# Patient Record
Sex: Male | Born: 1990 | Race: White | Hispanic: No | Smoking: Never smoker
Health system: Southern US, Community
[De-identification: ages and names within clinical notes are randomized; demographics above are authoritative.]

## PROBLEM LIST (undated history)

## (undated) DIAGNOSIS — I10 Essential (primary) hypertension: Secondary | ICD-10-CM

## (undated) DIAGNOSIS — T7840XA Allergy, unspecified, initial encounter: Secondary | ICD-10-CM

## (undated) HISTORY — DX: Essential (primary) hypertension: I10

## (undated) HISTORY — DX: Allergy, unspecified, initial encounter: T78.40XA

---
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
## 2013-02-28 ENCOUNTER — Ambulatory Visit: Payer: Self-pay | Admitting: Family Medicine

## 2013-03-02 LAB — BETA STREP CULTURE(ARMC)

## 2016-09-28 ENCOUNTER — Emergency Department: Payer: 59

## 2016-09-28 ENCOUNTER — Emergency Department
Admission: EM | Admit: 2016-09-28 | Discharge: 2016-09-28 | Disposition: A | Discharge status: Home / Self Care | Payer: 59 | Attending: Emergency Medicine | Admitting: Emergency Medicine

## 2016-09-28 ENCOUNTER — Encounter: Payer: Self-pay | Admitting: Emergency Medicine

## 2016-09-28 DIAGNOSIS — R1032 Left lower quadrant pain: Secondary | ICD-10-CM | POA: Diagnosis not present

## 2016-09-28 DIAGNOSIS — R112 Nausea with vomiting, unspecified: Secondary | ICD-10-CM | POA: Diagnosis not present

## 2016-09-28 DIAGNOSIS — R1013 Epigastric pain: Secondary | ICD-10-CM | POA: Diagnosis present

## 2016-09-28 DIAGNOSIS — K760 Fatty (change of) liver, not elsewhere classified: Secondary | ICD-10-CM | POA: Diagnosis not present

## 2016-09-28 LAB — COMPREHENSIVE METABOLIC PANEL
ALBUMIN: 4.9 g/dL (ref 3.5–5.0)
ALT: 51 U/L (ref 17–63)
ANION GAP: 8 (ref 5–15)
AST: 32 U/L (ref 15–41)
Alkaline Phosphatase: 73 U/L (ref 38–126)
BILIRUBIN TOTAL: 0.6 mg/dL (ref 0.3–1.2)
BUN: 17 mg/dL (ref 6–20)
CO2: 26 mmol/L (ref 22–32)
Calcium: 10 mg/dL (ref 8.9–10.3)
Chloride: 104 mmol/L (ref 101–111)
Creatinine, Ser: 0.98 mg/dL (ref 0.61–1.24)
GFR calc Af Amer: >60 mL/min (ref >60)
Glucose, Bld: 101 mg/dL — ABNORMAL HIGH (ref 65–99)
POTASSIUM: 4 mmol/L (ref 3.5–5.1)
Sodium: 138 mmol/L (ref 135–145)
TOTAL PROTEIN: 7.9 g/dL (ref 6.5–8.1)

## 2016-09-28 LAB — URINALYSIS COMPLETE WITH MICROSCOPIC (ARMC ONLY)
BACTERIA UA: NONE SEEN
Bilirubin Urine: NEGATIVE
GLUCOSE, UA: NEGATIVE mg/dL
HGB URINE DIPSTICK: NEGATIVE
Ketones, ur: NEGATIVE mg/dL
LEUKOCYTES UA: NEGATIVE
NITRITE: NEGATIVE
PROTEIN: NEGATIVE mg/dL
SPECIFIC GRAVITY, URINE: 1.023 (ref 1.005–1.030)
WBC UA: NONE SEEN WBC/hpf (ref 0–5)
pH: 6 (ref 5.0–8.0)

## 2016-09-28 LAB — CBC
HEMATOCRIT: 46.1 % (ref 40.0–52.0)
HEMOGLOBIN: 15.6 g/dL (ref 13.0–18.0)
MCH: 30.3 pg (ref 26.0–34.0)
MCHC: 33.9 g/dL (ref 32.0–36.0)
MCV: 89.3 fL (ref 80.0–100.0)
Platelets: 342 10*3/uL (ref 150–440)
RBC: 5.16 MIL/uL (ref 4.40–5.90)
RDW: 13.4 % (ref 11.5–14.5)
WBC: 11.6 10*3/uL — AB (ref 3.8–10.6)

## 2016-09-28 LAB — LIPASE, BLOOD: Lipase: 26 U/L (ref 11–51)

## 2016-09-28 MED ORDER — TRAMADOL HCL 50 MG PO TABS: 50.0000 mg | ORAL_TABLET | Freq: Four times a day (QID) | ORAL | 0 refills | Status: DC | PRN

## 2016-09-28 MED ORDER — ONDANSETRON 4 MG PO TBDP: 4.0000 mg | ORAL_TABLET | Freq: Three times a day (TID) | ORAL | 0 refills | Status: DC | PRN

## 2016-09-28 NOTE — ED Notes (Signed)
Pt c/o upper abdominal pain that began yesterday. Pt ambulatory to room. Pt alert and oriented X4, active, cooperative, pt in NAD. RR even and unlabored, color WNL.  Denies NVD.

## 2016-09-28 NOTE — ED Provider Notes (Signed)
[facility] Emergency Department Provider Note  Time seen: 4:47 PM  I have reviewed the triage vital signs and the nursing notes.   HISTORY  Chief Complaint Abdominal Pain    HPI Devon Roach is a 25 y.o. male with no past medical history who presents the emergency department with epigastric and left-sided abdominal pain. According to the patient since this morning he has been experiencing epigastric and left-sided abdominal pain. He states it was severe earlier, mild currently. Says she was some nausea and one episode of vomiting. Denies any diarrhea. States he has been somewhat constipated for the past several days. Denies any black or bloody stool. Denies any dysuria or hematuria. Denies any fever. Describes the pain as cramping in nature  History reviewed. No pertinent past medical history.  There are no active problems to display for this patient.   History reviewed. No pertinent surgical history.  Prior to Admission medications   Not on File    Allergies no known allergies  No family history on file.  Social History Social History  Substance Use Topics  . Smoking status: Never Smoker  . Smokeless tobacco: Never Used  . Alcohol use Yes    Review of Systems Constitutional: Negative for fever. Cardiovascular: Negative for chest pain. Respiratory: Negative for shortness of breath. Gastrointestinal: Epigastric and left-sided abdominal pain. Genitourinary: Negative for dysuria. Negative for hematuria Musculoskeletal: Negative for back pain. Neurological: Negative for headache 10-point ROS otherwise negative.  ____________________________________________   PHYSICAL EXAM:  VITAL SIGNS: ED Triage Vitals  Enc Vitals Group     BP 09/28/16 1552 (!) 173/94     Pulse Rate 09/28/16 1552 90     Resp 09/28/16 1550 16     Temp 09/28/16 1550 98 F (36.7 C)     Temp Source 09/28/16 1550 Oral     SpO2 09/28/16 1552 99 %     Weight 09/28/16  1551 270 lb (122.5 kg)     Height 09/28/16 1551 5\' 10"  (1.778 m)     Head Circumference --      Peak Flow --      Pain Score 09/28/16 1552 8     Pain Loc --      Pain Edu? --      Excl. in GC? --     Constitutional: Alert and oriented. Well appearing and in no distress. Eyes: Normal exam ENT   Head: Normocephalic and atraumatic.   Mouth/Throat: Mucous membranes are moist. Cardiovascular: Normal rate, regular rhythm. No murmur Respiratory: Normal respiratory effort without tachypnea nor retractions. Breath sounds are clear Gastrointestinal: Soft, moderate left lower quadrant tenderness palpation. No rebound or guarding. No distention. Musculoskeletal: Nontender with normal range of motion in all extremities.  Neurologic:  Normal speech and language. No gross focal neurologic deficits Skin:  Skin is warm, dry and intact.  Psychiatric: Mood and affect are normal.   ____________________________________________   RADIOLOGY  CT is negative  ____________________________________________   INITIAL IMPRESSION / ASSESSMENT AND PLAN / ED COURSE  Pertinent labs & imaging results that were available during my care of the patient were reviewed by me and considered in my medical decision making (see chart for details).  Patient presents the emergency department epigastric and left-sided abdominal pain since this morning. He states it was severe this morning, mild currently. Denies any diarrhea, fever. States he has been nauseated all day with one episode of vomiting. No history of kidney stones. We'll proceed with a CT of  the abdomen to evaluate the left flank. Patient agreeable.  CT scan of the abdomen is negative. No acute abnormality noted. Patient states he is feeling better. Suspect possible enteritis as a cause of the patient's symptoms. We will discharge this short course of pain medication and PCP follow-up. I discussed strict return precautions for any worsening pain or fever  over the next several days.  ____________________________________________   FINAL CLINICAL IMPRESSION(S) / ED DIAGNOSES  Left flank pain    Minna AntisKevin Sotiria Keast, MD 09/28/16 [phone]

## 2016-09-28 NOTE — ED Notes (Signed)
Pt alert and oriented X4, active, cooperative, pt in NAD. RR even and unlabored, color WNL.  Pt informed to return if any life threatening symptoms occur.   

## 2016-09-28 NOTE — ED Triage Notes (Signed)
C/O abdominal pain.  Started yesterday morning.  C/O epigastric pain.  Denies nausea vomiting.

## 2016-09-29 ENCOUNTER — Observation Stay
Admission: EM | Admit: 2016-09-29 | Discharge: 2016-09-30 | Disposition: A | Discharge status: Home / Self Care | Payer: 59 | Attending: Internal Medicine | Admitting: Internal Medicine

## 2016-09-29 ENCOUNTER — Emergency Department: Payer: 59

## 2016-09-29 ENCOUNTER — Encounter: Payer: Self-pay | Admitting: Emergency Medicine

## 2016-09-29 DIAGNOSIS — R1013 Epigastric pain: Principal | ICD-10-CM | POA: Insufficient documentation

## 2016-09-29 DIAGNOSIS — Z6841 Body Mass Index (BMI) 40.0 and over, adult: Secondary | ICD-10-CM | POA: Insufficient documentation

## 2016-09-29 DIAGNOSIS — E669 Obesity, unspecified: Secondary | ICD-10-CM | POA: Insufficient documentation

## 2016-09-29 DIAGNOSIS — R1012 Left upper quadrant pain: Secondary | ICD-10-CM | POA: Insufficient documentation

## 2016-09-29 DIAGNOSIS — K760 Fatty (change of) liver, not elsewhere classified: Secondary | ICD-10-CM | POA: Insufficient documentation

## 2016-09-29 DIAGNOSIS — D72829 Elevated white blood cell count, unspecified: Secondary | ICD-10-CM | POA: Diagnosis not present

## 2016-09-29 DIAGNOSIS — K529 Noninfective gastroenteritis and colitis, unspecified: Secondary | ICD-10-CM | POA: Diagnosis not present

## 2016-09-29 DIAGNOSIS — F1010 Alcohol abuse, uncomplicated: Secondary | ICD-10-CM | POA: Diagnosis not present

## 2016-09-29 DIAGNOSIS — R109 Unspecified abdominal pain: Secondary | ICD-10-CM | POA: Diagnosis present

## 2016-09-29 DIAGNOSIS — R739 Hyperglycemia, unspecified: Secondary | ICD-10-CM | POA: Diagnosis not present

## 2016-09-29 DIAGNOSIS — R1032 Left lower quadrant pain: Secondary | ICD-10-CM | POA: Diagnosis not present

## 2016-09-29 DIAGNOSIS — A09 Infectious gastroenteritis and colitis, unspecified: Secondary | ICD-10-CM | POA: Diagnosis not present

## 2016-09-29 LAB — CBC
HEMATOCRIT: 43.9 % (ref 40.0–52.0)
Hemoglobin: 14.8 g/dL (ref 13.0–18.0)
MCH: 30.2 pg (ref 26.0–34.0)
MCHC: 33.6 g/dL (ref 32.0–36.0)
MCV: 89.7 fL (ref 80.0–100.0)
Platelets: 358 10*3/uL (ref 150–440)
RBC: 4.9 MIL/uL (ref 4.40–5.90)
RDW: 13.2 % (ref 11.5–14.5)
WBC: 17.2 10*3/uL — ABNORMAL HIGH (ref 3.8–10.6)

## 2016-09-29 LAB — COMPREHENSIVE METABOLIC PANEL
ALBUMIN: 4.9 g/dL (ref 3.5–5.0)
ALT: 54 U/L (ref 17–63)
AST: 32 U/L (ref 15–41)
Alkaline Phosphatase: 68 U/L (ref 38–126)
Anion gap: 9 (ref 5–15)
BILIRUBIN TOTAL: 0.6 mg/dL (ref 0.3–1.2)
BUN: 14 mg/dL (ref 6–20)
CO2: 24 mmol/L (ref 22–32)
CREATININE: 0.97 mg/dL (ref 0.61–1.24)
Calcium: 9.4 mg/dL (ref 8.9–10.3)
Chloride: 103 mmol/L (ref 101–111)
GFR calc Af Amer: >60 mL/min (ref >60)
GLUCOSE: 151 mg/dL — AB (ref 65–99)
Potassium: 3.7 mmol/L (ref 3.5–5.1)
Sodium: 136 mmol/L (ref 135–145)
TOTAL PROTEIN: 7.9 g/dL (ref 6.5–8.1)

## 2016-09-29 LAB — URINALYSIS COMPLETE WITH MICROSCOPIC (ARMC ONLY)
BACTERIA UA: NONE SEEN
BILIRUBIN URINE: NEGATIVE
Glucose, UA: NEGATIVE mg/dL
HGB URINE DIPSTICK: NEGATIVE
Ketones, ur: NEGATIVE mg/dL
LEUKOCYTES UA: NEGATIVE
NITRITE: NEGATIVE
PH: 7 (ref 5.0–8.0)
Protein, ur: NEGATIVE mg/dL
RBC / HPF: NONE SEEN RBC/hpf (ref 0–5)
Specific Gravity, Urine: 1.009 (ref 1.005–1.030)

## 2016-09-29 LAB — TROPONIN I: Troponin I: <0.03 ng/mL (ref <0.03)

## 2016-09-29 LAB — LACTIC ACID, PLASMA: Lactic Acid, Venous: 1.2 mmol/L (ref 0.5–1.9)

## 2016-09-29 LAB — TSH: TSH: 1.306 u[IU]/mL (ref 0.350–4.500)

## 2016-09-29 LAB — LIPASE, BLOOD: LIPASE: 21 U/L (ref 11–51)

## 2016-09-29 MED ORDER — CIPROFLOXACIN IN D5W 400 MG/200ML IV SOLN
INTRAVENOUS | Status: AC
  Administered 2016-09-29: 400 mg via INTRAVENOUS
  Filled 2016-09-29: qty 200

## 2016-09-29 MED ORDER — MORPHINE SULFATE (PF) 4 MG/ML IV SOLN
4.0000 mg | Freq: Once | INTRAVENOUS | Status: AC
  Administered 2016-09-29: 4 mg via INTRAVENOUS
  Filled 2016-09-29: qty 1

## 2016-09-29 MED ORDER — SODIUM CHLORIDE 0.9 % IV BOLUS (SEPSIS)
1000.0000 mL | Freq: Once | INTRAVENOUS | Status: AC
  Administered 2016-09-29: 1000 mL via INTRAVENOUS

## 2016-09-29 MED ORDER — CIPROFLOXACIN IN D5W 400 MG/200ML IV SOLN
INTRAVENOUS | Status: AC
  Filled 2016-09-29: qty 200

## 2016-09-29 MED ORDER — ONDANSETRON HCL 4 MG/2ML IJ SOLN
4.0000 mg | Freq: Four times a day (QID) | INTRAMUSCULAR | Status: DC | PRN
  Administered 2016-09-29 (×2): 4 mg via INTRAVENOUS
  Filled 2016-09-29 (×2): qty 2

## 2016-09-29 MED ORDER — IOPAMIDOL (ISOVUE-300) INJECTION 61%
100.0000 mL | Freq: Once | INTRAVENOUS | Status: AC | PRN
  Administered 2016-09-29: 100 mL via INTRAVENOUS

## 2016-09-29 MED ORDER — PANTOPRAZOLE SODIUM 40 MG PO TBEC
40.0000 mg | DELAYED_RELEASE_TABLET | Freq: Two times a day (BID) | ORAL | Status: DC
  Administered 2016-09-29 – 2016-09-30 (×3): 40 mg via ORAL
  Filled 2016-09-29 (×2): qty 1

## 2016-09-29 MED ORDER — MORPHINE SULFATE (PF) 2 MG/ML IV SOLN
2.0000 mg | INTRAVENOUS | Status: DC | PRN
  Administered 2016-09-29 (×2): 2 mg via INTRAVENOUS
  Filled 2016-09-29 (×2): qty 1

## 2016-09-29 MED ORDER — IOPAMIDOL (ISOVUE-300) INJECTION 61%
30.0000 mL | Freq: Once | INTRAVENOUS | Status: AC | PRN
  Administered 2016-09-29: 30 mL via ORAL

## 2016-09-29 MED ORDER — CIPROFLOXACIN IN D5W 400 MG/200ML IV SOLN
400.0000 mg | Freq: Two times a day (BID) | INTRAVENOUS | Status: DC
  Administered 2016-09-29 – 2016-09-30 (×3): 400 mg via INTRAVENOUS
  Filled 2016-09-29 (×4): qty 200

## 2016-09-29 MED ORDER — PANTOPRAZOLE SODIUM 40 MG PO TBEC
DELAYED_RELEASE_TABLET | ORAL | Status: AC
  Administered 2016-09-29: 40 mg via ORAL
  Filled 2016-09-29: qty 1

## 2016-09-29 MED ORDER — ONDANSETRON HCL 4 MG/2ML IJ SOLN
4.0000 mg | Freq: Once | INTRAMUSCULAR | Status: AC
  Administered 2016-09-29: 4 mg via INTRAVENOUS
  Filled 2016-09-29: qty 2

## 2016-09-29 MED ORDER — ENOXAPARIN SODIUM 40 MG/0.4ML ~~LOC~~ SOLN
40.0000 mg | SUBCUTANEOUS | Status: DC
  Filled 2016-09-29: qty 0.4

## 2016-09-29 MED ORDER — ONDANSETRON HCL 4 MG PO TABS
4.0000 mg | ORAL_TABLET | Freq: Four times a day (QID) | ORAL | Status: DC | PRN
Start: 2016-09-29 — End: 2016-09-30

## 2016-09-29 MED ORDER — METRONIDAZOLE IN NACL 5-0.79 MG/ML-% IV SOLN
INTRAVENOUS | Status: AC
  Administered 2016-09-30: 15:00:00
  Filled 2016-09-29: qty 100

## 2016-09-29 MED ORDER — METRONIDAZOLE IN NACL 5-0.79 MG/ML-% IV SOLN
500.0000 mg | Freq: Three times a day (TID) | INTRAVENOUS | Status: DC
  Administered 2016-09-29 – 2016-09-30 (×4): 500 mg via INTRAVENOUS
  Filled 2016-09-29 (×6): qty 100

## 2016-09-29 NOTE — ED Notes (Signed)
MD at bedside. 

## 2016-09-29 NOTE — ED Provider Notes (Signed)
Patient signed out to me by Dr. Zenda AlpersWebster, she asked me to follow-up on the CT scan and discuss appropriate. Patient continues to have pain, CT scan is unremarkable although we did discuss the fatty liver. I will admit to the hospitalist service for further evaluation   Devon Everyobert Stellar Gensel, MD 09/29/16 [phone]

## 2016-09-29 NOTE — ED Notes (Signed)
Unsuccessful IV start x2 (RAC, LAC) asking another RN to try.

## 2016-09-29 NOTE — ED Triage Notes (Addendum)
Pt to triage via w/c with no distress noted; st seen here earlier and dx with small intestine infection; c/o persistent mid upper abd pain accomp by nausea/vomiting; pt refuses protocols stating "I just want something to help me go to sleep and take the pain away"; instructed on importance & purpose of performing protocols but pt cont to refuses stating "I already gave 4 vials, I ain't giving no more"

## 2016-09-29 NOTE — ED Provider Notes (Signed)
[facility] Emergency Department Provider Note   ____________________________________________   First MD Initiated Contact with Patient 09/29/16 [phone]     (approximate)  I have reviewed the triage vital signs and the nursing notes.   HISTORY  Chief Complaint Abdominal Pain    HPI Devon Roach is a 25 y.o. male who comes into the hospital today with epigastric pain and vomiting. The patient was seen here earlier in the day and was let go. He did have a scan that was negative. He reports that he's never had any pain like this before. This pain started yesterday. He reports that his emesis looks like what he ate. His pain is a 10 out of 10 in intensity and has been getting worse. He reports that he's had no fevers. He's had some shortness of breath. He thinks it's because of the pain. He has had some diarrhea earlier when he arrived to the emergency department. When the patient was seen in the emergency department earlier he was told to come back to the hospital if his pain is getting worse. The patient is unable to tolerate the pain at this time. He has no chest pain or ache and denies any blurred vision.   History reviewed. No pertinent past medical history.  There are no active problems to display for this patient.   History reviewed. No pertinent surgical history.  Prior to Admission medications   Medication Sig Start Date End Date Taking? Authorizing Provider  ondansetron (ZOFRAN ODT) 4 MG disintegrating tablet Take 1 tablet (4 mg total) by mouth every 8 (eight) hours as needed for nausea or vomiting. 09/28/16  Yes Minna Antis, MD  traMADol (ULTRAM) 50 MG tablet Take 1 tablet (50 mg total) by mouth every 6 (six) hours as needed. 09/28/16 09/28/17 Yes Minna Antis, MD    Allergies Review of patient's allergies indicates no known allergies.  No family history on file.  Social History Social History  Substance Use Topics  . Smoking status:  Never Smoker  . Smokeless tobacco: Never Used  . Alcohol use Yes    Review of Systems Constitutional: No fever/chills Eyes: No visual changes. ENT: No sore throat. Cardiovascular: Denies chest pain. Respiratory: Denies shortness of breath. Gastrointestinal:  abdominal pain.  nausea, vomiting.  No diarrhea.  No constipation. Genitourinary: Negative for dysuria. Musculoskeletal: Negative for back pain. Skin: Negative for rash. Neurological: Negative for headaches, focal weakness or numbness.  10-point ROS otherwise negative.  ____________________________________________   PHYSICAL EXAM:  VITAL SIGNS: ED Triage Vitals  Enc Vitals Group     BP 09/29/16 0232 (!) 187/110     Pulse Rate 09/29/16 0232 (!) 51     Resp 09/29/16 0232 20     Temp 09/29/16 0232 97.7 F (36.5 C)     Temp Source 09/29/16 0232 Oral     SpO2 09/29/16 0232 100 %     Weight 09/29/16 0233 270 lb (122.5 kg)     Height 09/29/16 0233 5\' 10"  (1.778 m)     Head Circumference --      Peak Flow --      Pain Score 09/29/16 0234 10     Pain Loc --      Pain Edu? --      Excl. in GC? --     Constitutional: Alert and oriented. Well appearing and in Moderate distress. Eyes: Conjunctivae are normal. PERRL. EOMI. Head: Atraumatic. Nose: No congestion/rhinnorhea. Mouth/Throat: Mucous membranes are moist.  Oropharynx non-erythematous.  Cardiovascular: Normal rate, regular rhythm. Grossly normal heart sounds.  Good peripheral circulation. Respiratory: Normal respiratory effort.  No retractions. Lungs CTAB. Gastrointestinal: Soft and nontender. No distention. Positive bowel sounds Musculoskeletal: No lower extremity tenderness nor edema.   Neurologic:  Normal speech and language.  Skin:  Skin is warm, dry and intact. Periorbital petechiae noted Psychiatric: Mood and affect are normal.   ____________________________________________   LABS (all labs ordered are listed, but only abnormal results are  displayed)  Labs Reviewed  CBC - Abnormal; Notable for the following:       Result Value   WBC 17.2 (*)    All other components within normal limits  COMPREHENSIVE METABOLIC PANEL - Abnormal; Notable for the following:    Glucose, Bld 151 (*)    All other components within normal limits  LIPASE, BLOOD   ____________________________________________  EKG  None ____________________________________________  RADIOLOGY  Ultrasound abdomen CT abdomen and pelvis ____________________________________________   PROCEDURES  Procedure(s) performed: None  Procedures  Critical Care performed: No  ____________________________________________   INITIAL IMPRESSION / ASSESSMENT AND PLAN / ED COURSE  Pertinent labs & imaging results that were available during my care of the patient were reviewed by me and considered in my medical decision making (see chart for details).  This is a 25 year old male who comes into the hospital today with abdominal pain. The patient had this pain and was seen yesterday. He reports that his epigastric pain. He is vomiting as well. The patient does have some petechiae around his eyes from his forceful vomiting. The patient reports that he could not tolerate the pain home to return to the emergency department. We did repeat the patient's blood work and his white count is now 17.2 up from 11.4. I did send the patient for an ultrasound to evaluate his pain.  Clinical Course  Value Comment By Time  US Abdomen Limited RUQ No acute RIGHT upper quadrant process.  Hepatic steatosis.   Rebecka ApleyAllison P Webster, MD 10/04 [phone]   After reviewing the results of the ultrasound I talked to the patient. At this time the only choice is to do a repeat CT with contrast. The patient reports he is still hurting after 2 doses of morphine. We'll see if we can determine the cause of his pain.  The patient's care will be signed out to Dr. Cyril LoosenKinner who will follow-up the results of the  CT scan and disposition the patient. ____________________________________________   FINAL CLINICAL IMPRESSION(S) / ED DIAGNOSES  Final diagnoses:  Abdominal pain      NEW MEDICATIONS STARTED DURING THIS VISIT:  New Prescriptions   No medications on file     Note:  This document was prepared using Dragon voice recognition software and may include unintentional dictation errors.    Rebecka ApleyAllison P Webster, MD 09/29/16 [phone]

## 2016-09-29 NOTE — H&P (Signed)
[facility] - [place] at [facility]   PATIENT NAME: Devon Roach    MR#:  [id number]  DATE OF BIRTH:  06/03/1991  DATE OF ADMISSION:  09/29/2016  PRIMARY CARE PHYSICIAN: No PCP Per Patient   REQUESTING/REFERRING PHYSICIAN:   CHIEF COMPLAINT:   Chief Complaint  Patient presents with  . Abdominal Pain    HISTORY OF PRESENT ILLNESS: Devon Roach  is a 25 y.o. male with not significant past medical history who presents to the hospital with complaints of abdominal pain. When the patient, pain started about 2 nights ago, pain is described as sharp, cramping pain all over the abdomen, yesterday it was in left lower quadrant today in right upper quadrant, epigastric area, right upper quadrant. It is associated with nausea and vomiting, but no hematochezia or hematemesis. He also had few episodes of loose yellow liquidy stool. Because of abdominal pain. He presented to emergency room yesterday, was given medications, had CT scan of abdomen without contrast, which was unremarkable, and he was discharged. However, continued to have severe pains in upper abdomen, pain was described as sharp, intermittent, however, now it became constant, worsening with turning to the left side, but no significant change after eating, vomiting. The patient also noted that he is more presyncopal, having hot flashes and feeling cold intermittently and the feeling that his  urinary output has decreased. He also cannot empty his bladder. He denies any tobacco abuse, however, admits of alcohol use, couple of beers a week. In the past, however cutting down on alcohol abuse. He presented back to emergency room today, had CT scan of abdomen and pelvis with contrast, which still was unremarkable, but fatty liver, he also underwent a right upper quadrant ultrasound, normal, but fatty liver was noted. Hospitalist services were contacted for admission as patient pain remains severe. No drug abuse history  PAST  MEDICAL HISTORY:  History reviewed. No pertinent past medical history.  PAST SURGICAL HISTORY: History reviewed. No pertinent surgical history.  SOCIAL HISTORY:  Social History  Substance Use Topics  . Smoking status: Never Smoker  . Smokeless tobacco: Never Used  . Alcohol use Yes    FAMILY HISTORY: No early coronary artery disease  DRUG ALLERGIES: No Known Allergies  Review of Systems  Constitutional: Positive for chills, fever and malaise/fatigue. Negative for weight loss.  HENT: Negative for congestion.   Eyes: Negative for blurred vision and double vision.  Respiratory: Negative for cough, sputum production, shortness of breath and wheezing.   Cardiovascular: Negative for chest pain, palpitations, orthopnea, leg swelling and PND.  Gastrointestinal: Positive for abdominal pain, diarrhea, nausea and vomiting. Negative for blood in stool and constipation.  Genitourinary: Negative for dysuria, frequency, hematuria and urgency.  Musculoskeletal: Negative for falls.  Neurological: Negative for dizziness, tremors, focal weakness and headaches.  Endo/Heme/Allergies: Does not bruise/bleed easily.  Psychiatric/Behavioral: Negative for depression. The patient does not have insomnia.     MEDICATIONS AT HOME:  Prior to Admission medications   Medication Sig Start Date End Date Taking? Authorizing Provider  ondansetron (ZOFRAN ODT) 4 MG disintegrating tablet Take 1 tablet (4 mg total) by mouth every 8 (eight) hours as needed for nausea or vomiting. 09/28/16  Yes Minna Antis, MD  traMADol (ULTRAM) 50 MG tablet Take 1 tablet (50 mg total) by mouth every 6 (six) hours as needed. 09/28/16 09/28/17 Yes Minna Antis, MD      PHYSICAL EXAMINATION:   VITAL SIGNS: Blood pressure (!) 162/109, pulse 61, temperature 97.7  F (36.5 C), temperature source Oral, resp. rate (!) 26, height 5\' 10"  (1.778 m), weight 122.5 kg (270 lb), SpO2 98 %.  GENERAL:  25 y.o.-year-old young, obese patient  lying in the bed In mild to moderate distress due to abdominal pain, diaphoretic, flushed and face.  EYES: Pupils equal, round, reactive to light and accommodation. No scleral icterus. Extraocular muscles intact.  HEENT: Head atraumatic, normocephalic. Oropharynx and nasopharynx clear.  NECK:  Supple, no jugular venous distention. No thyroid enlargement, no tenderness.  LUNGS: Normal breath sounds bilaterally, no wheezing, rales,rhonchi or crepitation. No use of accessory muscles of respiration.  CARDIOVASCULAR: S1, S2 normal. No murmurs, rubs, or gallops.  ABDOMEN: Soft, tender in upper abdomen, mostly epigastric area, but no rebound or guarding was noted, nondistended. Bowel sounds present. No organomegaly or mass.  EXTREMITIES: No pedal edema, cyanosis, or clubbing.  NEUROLOGIC: Cranial nerves II through XII are intact. Muscle strength 5/5 in all extremities. Sensation intact. Gait not checked.  PSYCHIATRIC: The patient is alert and oriented x 3.  SKIN: No obvious rash, lesion, or ulcer.   LABORATORY PANEL:   CBC  Recent Labs Lab 09/28/16 1555 09/29/16 0527  WBC 11.6* 17.2*  HGB 15.6 14.8  HCT 46.1 43.9  PLT 342 358  MCV 89.3 89.7  MCH 30.3 30.2  MCHC 33.9 33.6  RDW 13.4 13.2   ------------------------------------------------------------------------------------------------------------------  Chemistries   Recent Labs Lab 09/29/16 0527  NA 136  K 3.7  CL 103  CO2 24  GLUCOSE 151*  BUN 14  CREATININE 0.97  CALCIUM 9.4  AST 32  ALT 54  ALKPHOS 68  BILITOT 0.6   ------------------------------------------------------------------------------------------------------------------  Cardiac Enzymes  Recent Labs Lab 09/29/16 0527  TROPONINI <0.03   ------------------------------------------------------------------------------------------------------------------  RADIOLOGY: Ct Abdomen Pelvis W Contrast  Result Date: 09/29/2016 CLINICAL DATA:  Acute epigastric  abdominal pain. EXAM: CT ABDOMEN AND PELVIS WITH CONTRAST TECHNIQUE: Multidetector CT imaging of the abdomen and pelvis was performed using the standard protocol following bolus administration of intravenous contrast. CONTRAST:  100mL ISOVUE-300 IOPAMIDOL (ISOVUE-300) INJECTION 61% COMPARISON:  CT scan of September 28, 2016. FINDINGS: Lower chest: Visualized lung bases are unremarkable. Hepatobiliary: No gallstones are noted. Fatty infiltration of the liver is noted. Pancreas: Normal. Spleen: Normal. Adrenals/Urinary Tract: Adrenal glands and kidneys appear normal. No hydronephrosis or renal obstruction is noted. No renal or ureteral calculi are noted. Urinary bladder appears normal. Stomach/Bowel: The appendix appears normal. There is no evidence of bowel obstruction. Vascular/Lymphatic: No significant adenopathy is noted. Reproductive: Normal prostate gland. Other: No abnormal fluid collection is noted. Musculoskeletal: No significant osseous abnormality is noted. IMPRESSION: Fatty infiltration of the liver. No other abnormality seen in the abdomen or pelvis. Electronically Signed   By: Lupita RaiderJames  Green Jr, M.D.   On: 09/29/2016 09:00   Ct Renal Stone Study  Result Date: 09/28/2016 CLINICAL DATA:  Epigastric abdominal pain started yesterday. Left flank pain. EXAM: CT ABDOMEN AND PELVIS WITHOUT CONTRAST TECHNIQUE: Multidetector CT imaging of the abdomen and pelvis was performed following the standard protocol without IV contrast. COMPARISON:  None. FINDINGS: Lower chest: Lung bases are clear.  No pleural effusions. Hepatobiliary: Liver is diffusely low density and suggestive for hepatic steatosis. There is focal fat sparing adjacent to the gallbladder. Normal appearance of the gallbladder. No significant biliary dilatation. Pancreas: Normal appearance of the pancreas without inflammation or duct dilatation. Spleen: Normal appearance of spleen without enlargement. Adrenals/Urinary Tract: Normal adrenal glands. Normal  appearance of both kidneys without stones or hydronephrosis.  Urinary bladder is unremarkable. Stomach/Bowel: Stomach is within normal limits. Appendix appears normal. No evidence of bowel wall thickening, distention, or inflammatory changes. Vascular/Lymphatic: No significant vascular findings. Slightly prominent inguinal lymph nodes are nonspecific. Otherwise, no significant lymph node enlargement in the abdomen or pelvis. Reproductive: No gross abnormality to the prostate or seminal vesicles. Other: No free fluid.  No free air. Musculoskeletal: No acute or significant osseous findings. IMPRESSION: No acute abnormality in the abdomen or pelvis. Hepatic steatosis. Electronically Signed   By: Richarda Overlie M.D.   On: 09/28/2016 18:07   US Abdomen Limited Ruq  Result Date: 09/29/2016 CLINICAL DATA:  Abdominal pain for 2 days. EXAM: US ABDOMEN LIMITED - RIGHT UPPER QUADRANT COMPARISON:  CT abdomen and pelvis September 28, 2016 FINDINGS: Gallbladder: No gallstones or wall thickening visualized. No sonographic Murphy sign noted by sonographer. Common bile duct: Diameter: 3 mm Liver: No focal lesion identified. Diffusely increased echogenicity. No intrahepatic biliary dilatation. IMPRESSION: No acute RIGHT upper quadrant process. Hepatic steatosis. Electronically Signed   By: Awilda Metro M.D.   On: 09/29/2016 06:28    EKG: No orders found for this or any previous visit.  IMPRESSION AND PLAN:  Active Problems:   Right sided abdominal pain #1. Acute gastroenteritis, concerning for infectious, get stool cultures, including C. difficile, initiate patient on Cipro and Flagyl, follow clinically, get lactic acid level #2. Leukocytosis, follow with antibiotic therapy #3. Hyperglycemia in obese patient, get hemoglobin A1c to rule out diabetes #4. Fatty liver disease, getting hemoglobin A1c, TSH, lipid panel, patient was counseled about alcohol abuse and risk of developing liver cirrhosis, he voiced understanding,  all questions were answered  All the records are reviewed and case discussed with ED provider. Management plans discussed with the patient, family and they are in agreement.  CODE STATUS: Code Status History    This patient does not have a recorded code status. Please follow your organizational policy for patients in this situation.       TOTAL TIME TAKING CARE OF THIS PATIENT: 60 minutes.   Discussed with the patient and his family Jamarri Vuncannon M.D on 09/29/2016 at 10:35 AM  Between 7am to 6pm - Pager - [phone] After 6pm go to [email or web address] - password [email or web address] [facility]  [place] [facility]  Office  [phone]  CC: Primary care physician; No PCP Per Patient

## 2016-09-30 DIAGNOSIS — R1032 Left lower quadrant pain: Secondary | ICD-10-CM | POA: Diagnosis not present

## 2016-09-30 DIAGNOSIS — R109 Unspecified abdominal pain: Secondary | ICD-10-CM | POA: Diagnosis not present

## 2016-09-30 DIAGNOSIS — R739 Hyperglycemia, unspecified: Secondary | ICD-10-CM | POA: Diagnosis not present

## 2016-09-30 DIAGNOSIS — R1013 Epigastric pain: Secondary | ICD-10-CM | POA: Diagnosis not present

## 2016-09-30 DIAGNOSIS — R030 Elevated blood-pressure reading, without diagnosis of hypertension: Secondary | ICD-10-CM | POA: Diagnosis not present

## 2016-09-30 DIAGNOSIS — Z6841 Body Mass Index (BMI) 40.0 and over, adult: Secondary | ICD-10-CM | POA: Diagnosis not present

## 2016-09-30 DIAGNOSIS — D72829 Elevated white blood cell count, unspecified: Secondary | ICD-10-CM | POA: Diagnosis not present

## 2016-09-30 DIAGNOSIS — R1012 Left upper quadrant pain: Secondary | ICD-10-CM | POA: Diagnosis not present

## 2016-09-30 DIAGNOSIS — E669 Obesity, unspecified: Secondary | ICD-10-CM | POA: Diagnosis not present

## 2016-09-30 DIAGNOSIS — K529 Noninfective gastroenteritis and colitis, unspecified: Secondary | ICD-10-CM | POA: Diagnosis not present

## 2016-09-30 DIAGNOSIS — K760 Fatty (change of) liver, not elsewhere classified: Secondary | ICD-10-CM | POA: Diagnosis not present

## 2016-09-30 LAB — COMPREHENSIVE METABOLIC PANEL
ALT: 47 U/L (ref 17–63)
ANION GAP: 8 (ref 5–15)
AST: 21 U/L (ref 15–41)
Albumin: 4.5 g/dL (ref 3.5–5.0)
Alkaline Phosphatase: 71 U/L (ref 38–126)
BUN: 8 mg/dL (ref 6–20)
CHLORIDE: 104 mmol/L (ref 101–111)
CO2: 27 mmol/L (ref 22–32)
Calcium: 9.4 mg/dL (ref 8.9–10.3)
Creatinine, Ser: 1.02 mg/dL (ref 0.61–1.24)
GFR calc non Af Amer: >60 mL/min (ref >60)
Glucose, Bld: 116 mg/dL — ABNORMAL HIGH (ref 65–99)
POTASSIUM: 3.6 mmol/L (ref 3.5–5.1)
SODIUM: 139 mmol/L (ref 135–145)
Total Bilirubin: 1 mg/dL (ref 0.3–1.2)
Total Protein: 7.6 g/dL (ref 6.5–8.1)

## 2016-09-30 LAB — GLUCOSE, CAPILLARY: GLUCOSE-CAPILLARY: 112 mg/dL — AB (ref 65–99)

## 2016-09-30 LAB — LIPID PANEL
CHOL/HDL RATIO: 5.5 ratio
CHOLESTEROL: 170 mg/dL (ref 0–200)
HDL: 31 mg/dL — ABNORMAL LOW (ref >40)
LDL Cholesterol: 114 mg/dL — ABNORMAL HIGH (ref 0–99)
Triglycerides: 125 mg/dL (ref <150)
VLDL: 25 mg/dL (ref 0–40)

## 2016-09-30 LAB — CBC
HCT: 45.6 % (ref 40.0–52.0)
HEMOGLOBIN: 16 g/dL (ref 13.0–18.0)
MCH: 31.2 pg (ref 26.0–34.0)
MCHC: 35.1 g/dL (ref 32.0–36.0)
MCV: 88.8 fL (ref 80.0–100.0)
Platelets: 360 10*3/uL (ref 150–440)
RBC: 5.13 MIL/uL (ref 4.40–5.90)
RDW: 13.7 % (ref 11.5–14.5)
WBC: 10 10*3/uL (ref 3.8–10.6)

## 2016-09-30 LAB — HEMOGLOBIN A1C
HEMOGLOBIN A1C: 5.4 % (ref 4.8–5.6)
MEAN PLASMA GLUCOSE: 108 mg/dL

## 2016-09-30 MED ORDER — PANTOPRAZOLE SODIUM 40 MG PO TBEC: 40.0000 mg | DELAYED_RELEASE_TABLET | Freq: Every day | ORAL | 0 refills | Status: DC

## 2016-09-30 MED ORDER — LISINOPRIL 20 MG PO TABS: 20.0000 mg | ORAL_TABLET | Freq: Every day | ORAL | 0 refills | Status: DC

## 2016-09-30 MED ORDER — LISINOPRIL 20 MG PO TABS
20.0000 mg | ORAL_TABLET | Freq: Every day | ORAL | Status: DC
  Administered 2016-09-30: 20 mg via ORAL
  Filled 2016-09-30: qty 1

## 2016-09-30 MED ORDER — DOCUSATE SODIUM 100 MG PO CAPS
100.0000 mg | ORAL_CAPSULE | Freq: Once | ORAL | Status: AC
  Administered 2016-09-30: 15:00:00 100 mg via ORAL
  Filled 2016-09-30: qty 1

## 2016-09-30 MED ORDER — TRAMADOL HCL 50 MG PO TABS
50.0000 mg | ORAL_TABLET | Freq: Four times a day (QID) | ORAL | Status: DC | PRN
  Administered 2016-09-30: 50 mg via ORAL
  Filled 2016-09-30: qty 1

## 2016-09-30 NOTE — Progress Notes (Signed)
Received Md order to discharge patient to home reviewed discharge instructions prescriptions and home meds with patient and mother both verbalized understanding

## 2016-09-30 NOTE — Progress Notes (Signed)
Nutrition Education Note  RD consulted for nutrition education regarding weight loss and low sodium diet   RD provided "Weight Loss Tips and Low Sodium Nutrition Therapy" handout from the Academy of Nutrition and Dietetics. Reviewed patient's dietary recall. Provided examples on ways to decrease sodium and fat intake in diet. Discouraged intake of processed foods and use of salt shaker. Encouraged fresh fruits and vegetables as well as whole grain sources of carbohydrates to maximize fiber intake. Discussed low calorie food options and importance of exercise to meet wt loss goals. Teach back method used.  Expect good compliance.  Body mass index is 39.95 kg/m. Pt meets criteria for obesity unspecified based on current BMI.  Current diet order is soft, patient is consuming approximately 100 % of meals at this time. Labs and medications reviewed. No further nutrition interventions warranted at this time. RD contact information provided. If additional nutrition issues arise, please re-consult RD.  Ailyn Gladd B. Freida BusmanAllen, RD, LDN [phone] (pager) Weekend/On-Call pager [phone])

## 2016-09-30 NOTE — Discharge Summary (Signed)
[facility] - [place] at [facility]   PATIENT NAME: Leevon Upperman    MR#:  [id number]  DATE OF BIRTH:  25-Feb-1991  DATE OF ADMISSION:  09/29/2016 ADMITTING PHYSICIAN: Katharina Caper, MD  DATE OF DISCHARGE: 09/30/2016  PRIMARY CARE PHYSICIAN: Dr Venora Maples    ADMISSION DIAGNOSIS:  Abdominal pain [R10.9]  DISCHARGE DIAGNOSIS:  Active Problems:   Right sided abdominal pain   SECONDARY DIAGNOSIS:  History reviewed. No pertinent past medical history.  HOSPITAL COURSE:   25 year old male with obesity who presents with abdominal pain. For further details please further H&P.  1. Abdominal pain: Patient is describing midepigastric abdominal pain with nausea. CT scan on admission showed fatty liver but no acute etiology. Right upper quadrant ultrasound also showed fatty liver but no gallstones. I am suspecting the patient must have gastritis even the location of pain. Patient's symptoms have improved with Protonix. He will continue this for 30 days. If pain persists then he will need GI evaluation.  2. Fatty liver: Patient is encouraged to exercise and lose weight. Dietitian consult was requested.  3. Elevated blood pressure no diagnosis of hypertension: Patient had persistently elevated blood pressures. Patient is started on lisinopril 20 mg daily. Patient will follow-up with primary care physician in one week for blood pressure as well as BMP. He was asked to monitor his blood pressure twice a day at the same time every day and log this for his primary care physician.  4. Obesity: Patient is encouraged to lose weight and exercise.  DISCHARGE CONDITIONS AND DIET:  Stable Soft diet  CONSULTS OBTAINED:    DRUG ALLERGIES:  No Known Allergies  DISCHARGE MEDICATIONS:   Current Discharge Medication List    START taking these medications   Details  lisinopril (PRINIVIL,ZESTRIL) 20 MG tablet Take 1 tablet (20 mg total) by mouth daily. Qty: 30 tablet, Refills: 0     pantoprazole (PROTONIX) 40 MG tablet Take 1 tablet (40 mg total) by mouth daily. Qty: 30 tablet, Refills: 0      CONTINUE these medications which have NOT CHANGED   Details  ondansetron (ZOFRAN ODT) 4 MG disintegrating tablet Take 1 tablet (4 mg total) by mouth every 8 (eight) hours as needed for nausea or vomiting. Qty: 20 tablet, Refills: 0      STOP taking these medications     traMADol (ULTRAM) 50 MG tablet               Today   CHIEF COMPLAINT:  Doing better this am tolerated diet this am  Abdominal pain improved   VITAL SIGNS:  Blood pressure (!) 164/102, pulse 83, temperature 98.8 F (37.1 C), resp. rate 18, height 5\' 10"  (1.778 m), weight 126.3 kg (278 lb 6.4 oz), SpO2 99 %.   REVIEW OF SYSTEMS:  Review of Systems  Constitutional: Negative.  Negative for chills, fever and malaise/fatigue.  HENT: Negative.  Negative for ear discharge, ear pain, hearing loss, nosebleeds and sore throat.   Eyes: Negative.  Negative for blurred vision and pain.  Respiratory: Negative.  Negative for cough, hemoptysis, shortness of breath and wheezing.   Cardiovascular: Negative.  Negative for chest pain, palpitations and leg swelling.  Gastrointestinal: Negative.  Negative for abdominal pain, blood in stool, diarrhea, nausea and vomiting.  Genitourinary: Negative.  Negative for dysuria.  Musculoskeletal: Negative.  Negative for back pain.  Skin: Negative.   Neurological: Negative for dizziness, tremors, speech change, focal weakness, seizures and headaches.  Endo/Heme/Allergies: Negative.  Does  not bruise/bleed easily.  Psychiatric/Behavioral: Negative.  Negative for depression, hallucinations and suicidal ideas.     PHYSICAL EXAMINATION:  GENERAL:  25 y.o.-year-old patient lying in the bed with no acute distress.  NECK:  Supple, no jugular venous distention. No thyroid enlargement, no tenderness.  LUNGS: Normal breath sounds bilaterally, no wheezing, rales,rhonchi  No use  of accessory muscles of respiration.  CARDIOVASCULAR: S1, S2 normal. No murmurs, rubs, or gallops.  ABDOMEN: Soft, non-tender, non-distended. Bowel sounds present. No organomegaly or mass.  EXTREMITIES: No pedal edema, cyanosis, or clubbing.  PSYCHIATRIC: The patient is alert and oriented x 3.  SKIN: No obvious rash, lesion, or ulcer.   DATA REVIEW:   CBC  Recent Labs Lab 09/30/16 0402  WBC 10.0  HGB 16.0  HCT 45.6  PLT 360    Chemistries   Recent Labs Lab 09/30/16 0402  NA 139  K 3.6  CL 104  CO2 27  GLUCOSE 116*  BUN 8  CREATININE 1.02  CALCIUM 9.4  AST 21  ALT 47  ALKPHOS 71  BILITOT 1.0    Cardiac Enzymes  Recent Labs Lab 09/29/16 0527  TROPONINI <0.03    Microbiology Results  @[email or web address] @  RADIOLOGY:  Ct Abdomen Pelvis W Contrast  Result Date: 09/29/2016 CLINICAL DATA:  Acute epigastric abdominal pain. EXAM: CT ABDOMEN AND PELVIS WITH CONTRAST TECHNIQUE: Multidetector CT imaging of the abdomen and pelvis was performed using the standard protocol following bolus administration of intravenous contrast. CONTRAST:  ISOVUE-300 IOPAMIDOL (ISOVUE-300) INJECTION 61% COMPARISON:  CT scan of September 28, 2016. FINDINGS: Lower chest: Visualized lung bases are unremarkable. Hepatobiliary: No gallstones are noted. Fatty infiltration of the liver is noted. Pancreas: Normal. Spleen: Normal. Adrenals/Urinary Tract: Adrenal glands and kidneys appear normal. No hydronephrosis or renal obstruction is noted. No renal or ureteral calculi are noted. Urinary bladder appears normal. Stomach/Bowel: The appendix appears normal. There is no evidence of bowel obstruction. Vascular/Lymphatic: No significant adenopathy is noted. Reproductive: Normal prostate gland. Other: No abnormal fluid collection is noted. Musculoskeletal: No significant osseous abnormality is noted. IMPRESSION: Fatty infiltration of the liver. No other abnormality seen in the abdomen or pelvis. Electronically  Signed   By: Lupita Raider, M.D.   On: 09/29/2016 09:00   Ct Renal Stone Study  Result Date: 09/28/2016 CLINICAL DATA:  Epigastric abdominal pain started yesterday. Left flank pain. EXAM: CT ABDOMEN AND PELVIS WITHOUT CONTRAST TECHNIQUE: Multidetector CT imaging of the abdomen and pelvis was performed following the standard protocol without IV contrast. COMPARISON:  None. FINDINGS: Lower chest: Lung bases are clear.  No pleural effusions. Hepatobiliary: Liver is diffusely low density and suggestive for hepatic steatosis. There is focal fat sparing adjacent to the gallbladder. Normal appearance of the gallbladder. No significant biliary dilatation. Pancreas: Normal appearance of the pancreas without inflammation or duct dilatation. Spleen: Normal appearance of spleen without enlargement. Adrenals/Urinary Tract: Normal adrenal glands. Normal appearance of both kidneys without stones or hydronephrosis. Urinary bladder is unremarkable. Stomach/Bowel: Stomach is within normal limits. Appendix appears normal. No evidence of bowel wall thickening, distention, or inflammatory changes. Vascular/Lymphatic: No significant vascular findings. Slightly prominent inguinal lymph nodes are nonspecific. Otherwise, no significant lymph node enlargement in the abdomen or pelvis. Reproductive: No gross abnormality to the prostate or seminal vesicles. Other: No free fluid.  No free air. Musculoskeletal: No acute or significant osseous findings. IMPRESSION: No acute abnormality in the abdomen or pelvis. Hepatic steatosis. Electronically Signed   By: Meriel Pica.D.  On: 09/28/2016 18:07   [place] Abdomen Limited Ruq  Result Date: 09/29/2016 CLINICAL DATA:  Abdominal pain for 2 days. EXAM: US ABDOMEN LIMITED - RIGHT UPPER QUADRANT COMPARISON:  CT abdomen and pelvis September 28, 2016 FINDINGS: Gallbladder: No gallstones or wall thickening visualized. No sonographic Murphy sign noted by sonographer. Common bile duct: Diameter: 3 mm  Liver: No focal lesion identified. Diffusely increased echogenicity. No intrahepatic biliary dilatation. IMPRESSION: No acute RIGHT upper quadrant process. Hepatic steatosis. Electronically Signed   By: Awilda Metroourtnay  Bloomer M.D.   On: 09/29/2016 06:28      Management plans discussed with the patient and he is in agreement. Stable for discharge home  Patient should follow up with pcp  CODE STATUS:     Code Status Orders        Start     Ordered   09/29/16 1205  Full code  Continuous     09/29/16 1204    Code Status History    Date Active Date Inactive Code Status Order ID Comments User Context   This patient has a current code status but no historical code status.      TOTAL TIME TAKING CARE OF THIS PATIENT: 35 minutes.    Note: This dictation was prepared with Dragon dictation along with smaller phrase technology. Any transcriptional errors that result from this process are unintentional.  Vesna Kable M.D on 09/30/2016 at 11:29 AM  Between 7am to 6pm - Pager - [phone] After 6pm go to [email or web address] - Social research officer, governmentpassword EPAS [email or web address]  [facility]  Office  [phone]  CC: Primary care physician;Hawkins, Fayrene FearingJames

## 2019-03-06 ENCOUNTER — Other Ambulatory Visit: Payer: Self-pay

## 2019-03-06 ENCOUNTER — Encounter: Payer: Self-pay | Admitting: Family Medicine

## 2019-03-06 ENCOUNTER — Ambulatory Visit (INDEPENDENT_AMBULATORY_CARE_PROVIDER_SITE_OTHER): Payer: No Typology Code available for payment source | Admitting: Family Medicine

## 2019-03-06 VITALS — BP 136/86 | HR 85 | Temp 98.7°F | Resp 16 | Ht 70.0 in | Wt 276.6 lb

## 2019-03-06 DIAGNOSIS — Z7689 Persons encountering health services in other specified circumstances: Secondary | ICD-10-CM

## 2019-03-06 DIAGNOSIS — E669 Obesity, unspecified: Secondary | ICD-10-CM | POA: Diagnosis not present

## 2019-03-06 DIAGNOSIS — J0100 Acute maxillary sinusitis, unspecified: Secondary | ICD-10-CM | POA: Diagnosis not present

## 2019-03-06 DIAGNOSIS — I10 Essential (primary) hypertension: Secondary | ICD-10-CM | POA: Diagnosis not present

## 2019-03-06 MED ORDER — AMOXICILLIN-POT CLAVULANATE 875-125 MG PO TABS: 1.0000 | ORAL_TABLET | Freq: Two times a day (BID) | ORAL | 0 refills | Status: DC

## 2019-03-06 NOTE — Assessment & Plan Note (Addendum)
Elevated initial BP, repeat manual check improved to near goal < 135/85 - still suboptimal but previously had history of significantly elevated now improved with weight loss and lifestyle overhaul - Home BP readings occasional home readings  Complicated by obesity Prior history on Lisinopril    Plan:  1. Off medication currently - remain off for now 2. Encourage improved lifestyle - low sodium diet, regular exercise 3. Start monitor BP outside office, bring readings to next visit, if persistently >140/90 or new symptoms notify office sooner 4. Follow-up 3 months for annual phys - re-check BP and review outside BP log, if improving may avoid medication and focus on lifestyle treatment, in future may need to restart lisinopril low dose or other med if indicated

## 2019-03-06 NOTE — Assessment & Plan Note (Signed)
Clinically with improvement in weight loss over past 1 month, 20+ lbs with diet and exercise overhaul Weight prior >300 lbs now improved  He plans to keep improving lifestyle for wt loss and improve his health Likely weight contributed to elevated blood pressure / hypertension  Due for annual and labs upcoming

## 2019-03-06 NOTE — Patient Instructions (Addendum)
Thank you for coming to the office today.  1. It sounds like you have a Sinusitis (Bacterial Infection) - this most likely started as an Upper Respiratory Virus that has settled into an infection. Allergies can also cause this. - Start Augmentin 1 pill twice daily (breakfast and dinner, with food and plenty of water) for 10 days, complete entire course, do not stop early even if feeling better - Start Loratadine (Claritin) 10mg  daily - Use current nasal steroid Flonase 2 sprays in each nostril daily for 4-6 weeks, may repeat course seasonally or as needed - Recommend to may try using Nasal Saline spray multiple times a day to help flush out congestion and clear sinuses - Improve hydration by drinking plenty of clear fluids (water, gatorade) to reduce secretions and thin congestion - Congestion draining down throat can cause irritation. May try warm herbal tea with honey, cough drops - Can take Tylenol or Ibuprofen as needed for fevers - May continue over the counter cold medicine as you are, I would not use any decongestant or mucinex longer than 7 days.  Can get the BEHIND THE COUNTER decongestant - original sudafed - as a backup plan only - it can raise blood pressure, unfortunately, it is strongest decongestant if you need it  If you develop persistent fever >101F for at least 3 consecutive days, headaches with sinus pain or pressure or persistent earache, please schedule a follow-up evaluation within next few days to week.  DUE for FASTING BLOOD WORK (no food or drink after midnight before the lab appointment, only water or coffee without cream/sugar on the morning of)  SCHEDULE "Lab Only" visit in the morning at the clinic for lab draw in 3 MONTHS   - Make sure Lab Only appointment is at about 1 week before your next appointment, so that results will be available  For Lab Results, once available within 2-3 days of blood draw, you can can log in to MyChart online to view your results and a  brief explanation. Also, we can discuss results at next follow-up visit.   Please schedule a Follow-up Appointment to: Return in about 3 months (around 06/06/2019) for Annual Physical.  If you have any other questions or concerns, please feel free to call the office or send a message through MyChart. You may also schedule an earlier appointment if necessary.  Additionally, you may be receiving a survey about your experience at our office within a few days to 1 week by e-mail or mail. We value your feedback.  Saralyn Pilar, DO [facility], [place]

## 2019-03-06 NOTE — Progress Notes (Signed)
Subjective:    Patient ID: Devon Roach, male    DOB: 1991/07/18, 28 y.o.   MRN: [id number]  Devon Roach is a 28 y.o. male presenting on 03/06/2019 for Cough (HA, sinus pressure denies chills or fever or bodyaches onset 2 weeks)  No previous regular medical doctor. Had seen Dr Juanetta Gosling here at [facility] back in 2014 for one sick visit. Now he is re-establishing care. Today for an acute sick visit.  HPI   Sinusitis Reports symptoms started about 2 weeks ago with sore throat and drainage and congestion mostly of sinuses, some thicker nasal drainage with some greenish-yellow color - Tried OTC medications limited relief. He did an Visual merchandiser and was given rx generic flonase with some relief of congestion but still persistent problem - Generic OTC allergy pill - has not started yet, usually for spring - Sick contact with father, mostly coughing - Admits persistent cough, worse at end of day and night-time - he has tried and failed tessalon in past. - Admits headaches - Denies any fevers, chills, sweats, body ache, ear pain  CHRONIC HTN / Obesity BMI >39 Reports he has checked BP occasionally outside office, at [employer] and pharmacy Recent significant weight loss >25+ lbs  Current Meds - None, previously on lisinopril temporarily 1 month years ago   Lifestyle: - Diet: now low sodium diet, drinks mostly water, no caffeine - Exercise: regular activity Denies CP, dyspnea, HA, edema, dizziness / lightheadedness  Additional social history He works outside often, works as Nutritional therapist, and also now Counselling psychologist in teaching, history among other topics and coaching.  Health Maintenance: Due for Flu Shot, declines today despite counseling on benefits  Future due for TDap and routine HIV screen.   Depression screen PHQ 2/9 03/06/2019  Decreased Interest 0  Down, Depressed, Hopeless 0  PHQ - 2 Score 0    History reviewed. No pertinent past medical history. History reviewed. No  pertinent surgical history. Social History   Socioeconomic History  . Marital status: Married    Spouse name: Not on file  . Number of children: Not on file  . Years of education: Not on file  . Highest education level: Not on file  Occupational History  . Not on file  Social Needs  . Financial resource strain: Not on file  . Food insecurity:    Worry: Not on file    Inability: Not on file  . Transportation needs:    Medical: Not on file    Non-medical: Not on file  Tobacco Use  . Smoking status: Never Smoker  . Smokeless tobacco: Never Used  Substance and Sexual Activity  . Alcohol use: Yes  . Drug use: No  . Sexual activity: Not on file  Lifestyle  . Physical activity:    Days per week: Not on file    Minutes per session: Not on file  . Stress: Not on file  Relationships  . Social connections:    Talks on phone: Not on file    Gets together: Not on file    Attends religious service: Not on file    Active member of club or organization: Not on file    Attends meetings of clubs or organizations: Not on file    Relationship status: Not on file  . Intimate partner violence:    Fear of current or ex partner: Not on file    Emotionally abused: Not on file    Physically abused: Not on file  Forced sexual activity: Not on file  Other Topics Concern  . Not on file  Social History Narrative  . Not on file   Family History  Problem Relation Age of Onset  . Hypertension Father    No current outpatient medications on file prior to visit.   No current facility-administered medications on file prior to visit.     Review of Systems Per HPI unless specifically indicated above     Objective:    BP 136/86 (BP Location: Left Arm, Cuff Size: Normal)   Pulse 85   Temp 98.7 F (37.1 C) (Oral)   Resp 16   Ht  (1.778 m)   Wt 276 lb 9.6 oz (125.5 kg)   SpO2 99%   BMI 39.69 kg/m   Wt Readings from Last 3 Encounters:  03/06/19 276 lb 9.6 oz (125.5 kg)    09/29/16 278 lb 6.4 oz (126.3 kg)  09/28/16 270 lb (122.5 kg)    Physical Exam Vitals signs and nursing note reviewed.  Constitutional:      General: He is not in acute distress.    Appearance: He is well-developed. He is not diaphoretic.     Comments: Well-appearing, comfortable, cooperative, obese  HENT:     Head: Normocephalic and atraumatic.     Comments: Frontal sinuses tender. Nares with turbinate edema and congestion without purulence. Bilateral TMs clear with mild clear effusion and fullness without erythema or bulging. Oropharynx mild posterior nasal drainage otherwise without erythema, exudates, edema or asymmetry. Eyes:     General:        Right eye: No discharge.        Left eye: No discharge.     Conjunctiva/sclera: Conjunctivae normal.  Neck:     Musculoskeletal: Normal range of motion and neck supple.     Thyroid: No thyromegaly.  Cardiovascular:     Rate and Rhythm: Normal rate and regular rhythm.     Heart sounds: Normal heart sounds. No murmur.  Pulmonary:     Effort: Pulmonary effort is normal. No respiratory distress.     Breath sounds: Normal breath sounds. No wheezing or rales.     Comments: Good air movement. No focal crackles, wheezing. Speaks full sentences. Musculoskeletal: Normal range of motion.  Lymphadenopathy:     Cervical: No cervical adenopathy.  Skin:    General: Skin is warm and dry.     Findings: No erythema or rash.  Neurological:     Mental Status: He is alert and oriented to person, place, and time.  Psychiatric:        Behavior: Behavior normal.     Comments: Well groomed, good eye contact, normal speech and thoughts    Results for orders placed or performed during the hospital encounter of 09/29/16  CBC  Result Value Ref Range   WBC 17.2 (H) 3.8 - 10.6 K/uL   RBC 4.90 4.40 - 5.90 MIL/uL   Hemoglobin 14.8 13.0 - 18.0 g/dL   HCT 19.1 47.8 - 29.5 %   MCV 89.7 80.0 - 100.0 fL   MCH 30.2 26.0 - 34.0 pg   MCHC 33.6 32.0 - 36.0 g/dL    RDW 62.1 30.8 - 65.7 %   Platelets 358 150 - 440 K/uL  Comprehensive metabolic panel  Result Value Ref Range   Sodium 136 135 - 145 mmol/L   Potassium 3.7 3.5 - 5.1 mmol/L   Chloride 103 101 - 111 mmol/L   CO2 24 22 - 32 mmol/L  Glucose, Bld 151 (H) 65 - 99 mg/dL   BUN 14 6 - 20 mg/dL   Creatinine, Ser 5.18 0.61 - 1.24 mg/dL   Calcium 9.4 8.9 - 84.1 mg/dL   Total Protein 7.9 6.5 - 8.1 g/dL   Albumin 4.9 3.5 - 5.0 g/dL   AST 32 15 - 41 U/L   ALT 54 17 - 63 U/L   Alkaline Phosphatase 68 38 - 126 U/L   Total Bilirubin 0.6 0.3 - 1.2 mg/dL   GFR calc non Af Amer >60 >60 mL/min   GFR calc Af Amer >60 >60 mL/min   Anion gap 9 5 - 15  Lipase, blood  Result Value Ref Range   Lipase 21 11 - 51 U/L  Troponin I  Result Value Ref Range   Troponin I <0.03 <0.03 ng/mL  Hemoglobin A1c  Result Value Ref Range   Hgb A1c MFr Bld 5.4 4.8 - 5.6 %   Mean Plasma Glucose 108 mg/dL  TSH  Result Value Ref Range   TSH 1.306 0.350 - 4.500 uIU/mL  Lactic acid, plasma  Result Value Ref Range   Lactic Acid, Venous 1.2 0.5 - 1.9 mmol/L  Urinalysis complete, with microscopic ([facility] only)  Result Value Ref Range   Color, Urine COLORLESS (A) YELLOW   APPearance CLEAR (A) CLEAR   Glucose, UA NEGATIVE NEGATIVE mg/dL   Bilirubin Urine NEGATIVE NEGATIVE   Ketones, ur NEGATIVE NEGATIVE mg/dL   Specific Gravity, Urine 1.009 1.005 - 1.030   Hgb urine dipstick NEGATIVE NEGATIVE   pH 7.0 5.0 - 8.0   Protein, ur NEGATIVE NEGATIVE mg/dL   Nitrite NEGATIVE NEGATIVE   Leukocytes, UA NEGATIVE NEGATIVE   RBC / HPF NONE SEEN 0 - 5 RBC/hpf   WBC, UA 0-5 0 - 5 WBC/hpf   Bacteria, UA NONE SEEN NONE SEEN   Squamous Epithelial / LPF 0-5 (A) NONE SEEN  Comprehensive metabolic panel  Result Value Ref Range   Sodium 139 135 - 145 mmol/L   Potassium 3.6 3.5 - 5.1 mmol/L   Chloride 104 101 - 111 mmol/L   CO2 27 22 - 32 mmol/L   Glucose, Bld 116 (H) 65 - 99 mg/dL   BUN 8 6 - 20 mg/dL   Creatinine, Ser 6.60 0.61 -  1.24 mg/dL   Calcium 9.4 8.9 - 63.0 mg/dL   Total Protein 7.6 6.5 - 8.1 g/dL   Albumin 4.5 3.5 - 5.0 g/dL   AST 21 15 - 41 U/L   ALT 47 17 - 63 U/L   Alkaline Phosphatase 71 38 - 126 U/L   Total Bilirubin 1.0 0.3 - 1.2 mg/dL   GFR calc non Af Amer >60 >60 mL/min   GFR calc Af Amer >60 >60 mL/min   Anion gap 8 5 - 15  CBC  Result Value Ref Range   WBC 10.0 3.8 - 10.6 K/uL   RBC 5.13 4.40 - 5.90 MIL/uL   Hemoglobin 16.0 13.0 - 18.0 g/dL   HCT 16.0 10.9 - 32.3 %   MCV 88.8 80.0 - 100.0 fL   MCH 31.2 26.0 - 34.0 pg   MCHC 35.1 32.0 - 36.0 g/dL   RDW 55.7 32.2 - 02.5 %   Platelets 360 150 - 440 K/uL  Lipid panel  Result Value Ref Range   Cholesterol 170 0 - 200 mg/dL   Triglycerides 427 <062 mg/dL   HDL 31 (L) >37 mg/dL   Total CHOL/HDL Ratio 5.5 RATIO   VLDL 25  0 - 40 mg/dL   LDL Cholesterol 071 (H) 0 - 99 mg/dL  Glucose, capillary  Result Value Ref Range   Glucose-Capillary 112 (H) 65 - 99 mg/dL      Assessment & Plan:   Problem List Items Addressed This Visit    Essential hypertension    Elevated initial BP, repeat manual check improved to near goal < 135/85 - still suboptimal but previously had history of significantly elevated now improved with weight loss and lifestyle overhaul - Home BP readings occasional home readings  Complicated by obesity Prior history on Lisinopril    Plan:  1. Off medication currently - remain off for now 2. Encourage improved lifestyle - low sodium diet, regular exercise 3. Start monitor BP outside office, bring readings to next visit, if persistently >140/90 or new symptoms notify office sooner 4. Follow-up 3 months for annual phys - re-check BP and review outside BP log, if improving may avoid medication and focus on lifestyle treatment, in future may need to restart lisinopril low dose or other med if indicated       Obesity (BMI 30-39.9)    Clinically with improvement in weight loss over past 1 month, 20+ lbs with diet and exercise  overhaul Weight prior >300 lbs now improved  He plans to keep improving lifestyle for wt loss and improve his health Likely weight contributed to elevated blood pressure / hypertension  Due for annual and labs upcoming       Other Visit Diagnoses    Acute non-recurrent maxillary sinusitis    -  Primary   Relevant Medications   amoxicillin-clavulanate (AUGMENTIN) 875-125 MG tablet   Encounter to establish care with new doctor          Consistent with acute frontal sinusitis, likely initially viral URI vs allergic rhinitis component with worsening concern for bacterial infection. Duration >2 weeks  Plan: 1. Start Augmentin 875-125mg  PO BID x 10 days 2. Resume Loratadine (Claritin) 10mg  daily and Flonase 2 sprays in each nostril daily for next 4-6 weeks, then may stop and use seasonally or as needed 3. Supportive care OTC med and saline. May try Sudafed behind the counter PRN rare use d/t elevated BP however as advised 4. Return criteria reviewed   Meds ordered this encounter  Medications  . amoxicillin-clavulanate (AUGMENTIN) 875-125 MG tablet    Sig: Take 1 tablet by mouth 2 (two) times daily. For 10 days    Dispense:  20 tablet    Refill:  0    Follow up plan: Return in about 3 months (around 06/06/2019) for Annual Physical.  Future labs for physical 06/05/19  Saralyn Pilar, DO [facility] 03/06/2019, 3:19 PM

## 2019-03-07 ENCOUNTER — Other Ambulatory Visit: Payer: Self-pay | Admitting: Family Medicine

## 2019-03-07 DIAGNOSIS — Z0000 Encounter for general adult medical examination without abnormal findings: Secondary | ICD-10-CM

## 2019-05-15 ENCOUNTER — Other Ambulatory Visit: Payer: Self-pay

## 2019-05-15 ENCOUNTER — Ambulatory Visit: Payer: No Typology Code available for payment source | Admitting: Family Medicine

## 2019-05-18 ENCOUNTER — Other Ambulatory Visit: Payer: Self-pay

## 2019-05-18 ENCOUNTER — Encounter: Payer: Self-pay | Admitting: Intensive Care

## 2019-05-18 ENCOUNTER — Emergency Department: Payer: No Typology Code available for payment source

## 2019-05-18 ENCOUNTER — Emergency Department
Admission: EM | Admit: 2019-05-18 | Discharge: 2019-05-18 | Disposition: A | Discharge status: Home / Self Care | Payer: No Typology Code available for payment source | Attending: Student in an Organized Health Care Education/Training Program | Admitting: Student in an Organized Health Care Education/Training Program

## 2019-05-18 DIAGNOSIS — R519 Headache, unspecified: Secondary | ICD-10-CM

## 2019-05-18 DIAGNOSIS — J0120 Acute ethmoidal sinusitis, unspecified: Secondary | ICD-10-CM | POA: Diagnosis not present

## 2019-05-18 DIAGNOSIS — U071 COVID-19: Secondary | ICD-10-CM | POA: Diagnosis not present

## 2019-05-18 DIAGNOSIS — I10 Essential (primary) hypertension: Secondary | ICD-10-CM | POA: Diagnosis not present

## 2019-05-18 DIAGNOSIS — R51 Headache: Secondary | ICD-10-CM | POA: Diagnosis not present

## 2019-05-18 DIAGNOSIS — J322 Chronic ethmoidal sinusitis: Secondary | ICD-10-CM

## 2019-05-18 DIAGNOSIS — R509 Fever, unspecified: Secondary | ICD-10-CM | POA: Diagnosis present

## 2019-05-18 LAB — CBC WITH DIFFERENTIAL/PLATELET
Abs Immature Granulocytes: 0.01 10*3/uL (ref 0.00–0.07)
Basophils Absolute: 0 10*3/uL (ref 0.0–0.1)
Basophils Relative: 0 %
Eosinophils Absolute: 0 10*3/uL (ref 0.0–0.5)
Eosinophils Relative: 0 %
HCT: 44.8 % (ref 39.0–52.0)
Hemoglobin: 15.7 g/dL (ref 13.0–17.0)
Immature Granulocytes: 0 %
Lymphocytes Relative: 9 %
Lymphs Abs: 0.8 10*3/uL (ref 0.7–4.0)
MCH: 31 pg (ref 26.0–34.0)
MCHC: 35 g/dL (ref 30.0–36.0)
MCV: 88.5 fL (ref 80.0–100.0)
Monocytes Absolute: 1.3 10*3/uL — ABNORMAL HIGH (ref 0.1–1.0)
Monocytes Relative: 15 %
Neutro Abs: 6.9 10*3/uL (ref 1.7–7.7)
Neutrophils Relative %: 76 %
Platelets: 266 10*3/uL (ref 150–400)
RBC: 5.06 MIL/uL (ref 4.22–5.81)
RDW: 12.8 % (ref 11.5–15.5)
WBC: 9.2 10*3/uL (ref 4.0–10.5)
nRBC: 0 % (ref 0.0–0.2)

## 2019-05-18 LAB — COMPREHENSIVE METABOLIC PANEL
ALT: 36 U/L (ref 0–44)
AST: 24 U/L (ref 15–41)
Albumin: 5 g/dL (ref 3.5–5.0)
Alkaline Phosphatase: 56 U/L (ref 38–126)
Anion gap: 13 (ref 5–15)
BUN: 13 mg/dL (ref 6–20)
CO2: 24 mmol/L (ref 22–32)
Calcium: 9.7 mg/dL (ref 8.9–10.3)
Chloride: 101 mmol/L (ref 98–111)
Creatinine, Ser: 0.97 mg/dL (ref 0.61–1.24)
GFR calc Af Amer: >60 mL/min (ref >60)
GFR calc non Af Amer: >60 mL/min (ref >60)
Glucose, Bld: 92 mg/dL (ref 70–99)
Potassium: 3.9 mmol/L (ref 3.5–5.1)
Sodium: 138 mmol/L (ref 135–145)
Total Bilirubin: 0.7 mg/dL (ref 0.3–1.2)
Total Protein: 8.2 g/dL — ABNORMAL HIGH (ref 6.5–8.1)

## 2019-05-18 LAB — INFLUENZA PANEL BY PCR (TYPE A & B)
Influenza A By PCR: NEGATIVE
Influenza B By PCR: NEGATIVE

## 2019-05-18 LAB — SARS CORONAVIRUS 2 BY RT PCR (HOSPITAL ORDER, PERFORMED IN ~~LOC~~ HOSPITAL LAB): SARS Coronavirus 2: POSITIVE — AB

## 2019-05-18 MED ORDER — SODIUM CHLORIDE 0.9 % IV BOLUS
1000.0000 mL | Freq: Once | INTRAVENOUS | Status: AC
  Administered 2019-05-18: 1000 mL via INTRAVENOUS

## 2019-05-18 MED ORDER — BUTALBITAL-APAP-CAFFEINE 50-325-40 MG PO TABS: 1.0000 | ORAL_TABLET | Freq: Four times a day (QID) | ORAL | 0 refills | Status: AC | PRN

## 2019-05-18 MED ORDER — PROCHLORPERAZINE EDISYLATE 10 MG/2ML IJ SOLN
10.0000 mg | Freq: Once | INTRAMUSCULAR | Status: AC
  Administered 2019-05-18: 17:00:00 10 mg via INTRAVENOUS
  Filled 2019-05-18: qty 2

## 2019-05-18 MED ORDER — ACETAMINOPHEN 500 MG PO TABS
1000.0000 mg | ORAL_TABLET | Freq: Once | ORAL | Status: AC
  Administered 2019-05-18: 1000 mg via ORAL

## 2019-05-18 MED ORDER — AZITHROMYCIN 500 MG PO TABS: 500.0000 mg | ORAL_TABLET | Freq: Every day | ORAL | 0 refills | Status: AC

## 2019-05-18 MED ORDER — AZITHROMYCIN 500 MG PO TABS
500.0000 mg | ORAL_TABLET | Freq: Once | ORAL | Status: AC
  Administered 2019-05-18: 500 mg via ORAL
  Filled 2019-05-18: qty 1

## 2019-05-18 NOTE — ED Triage Notes (Signed)
First Nurse Note:  Arrives to ED from [facility] for evaluation of fever, runny nose, and body aches.  Per [facility] paperwork, patient's temp 102.3.  Patient is AAOx3.  Skin warm and dry. No SOB/ DOE.

## 2019-05-18 NOTE — ED Triage Notes (Signed)
Patient reports LOC Wednesday and falling to the floor hitting head. Also reports fever since Wednesday. Now experiencing headaches, nausea, and chills. A&O x4

## 2019-05-18 NOTE — Discharge Instructions (Signed)
As discussed in the emergency department, you may use Tylenol and/or Ibuprofen for headaches. These are "Over the Counter" medications and can be found at most drug stores and grocery stores. Please use the recommended dosing instructions on the bottle/box. Do not exceed the maximum dose for either medications. Please be sure to rest and drink plenty of fluids. Please be sure to call your PCP for a follow-up visit, especially if your headaches persist.  Please call your physician or return to ED if you have: 1. Worsening or change in headaches. 2. Changes in vision. 3. New-onset nausea and vomiting. 4. Numbness, tingling, weakness in your extremities,. 4. Inability to eat or drink adequate amounts of food or liquids. 5. Chest pain, shortness of breath, or difficulty breathing. 6. Neurological changes- dizziness, fainting, loss of function of your arms, legs or other parts of your body. 7. Uncontrolled hypertension. 8. Or any other emergent concerns.   You have a viral illness which can have symptoms like muscle aches, fevers, chills, runny nose, cough, sneezing, sore throat, vomiting or diarrhea. One of the viruses that can cause this is SARS- CoV-2, the virus that causes COVID-19, also known as the novel coronavirus. You could also have a different viral infection such as the common cold or flu. Most patients with viral illness including COVID-19 have mild symptoms and recover on their own. Resting, staying hydrated, and sleeping are helpful. Today we think you are well enough to go home and treat your symptoms with oral liquids, and medicine for fevers, cough, and pain.  We generally do not do COVID-19 testing on people with mild symptoms who are being discharged from the Emergency Department or Clinic.   If we did a test for COVID-19 the results will not be available for several days. We will call you with the result. Please DO NOT CONTACT THE EMERGENCY DEPARTMENT OR CLINIC FOR RESULTS OF THIS  TEST.   Please follow the precautions below:  Stay home for at least 7 days after your symptoms began OR for 3 days after your fever ends, whichever takes longer. ?  If people live with you they should also stay home and avoid contact with others for 14 days.?   ISOLATION GUIDANCE FOR POSSIBLE COVID Most people with cough and fever have an illness caused by a virus. One is COVID-19. Not all people with these infections are being tested for the virus that causes COVID-19. People who might have COVID but are not being tested should still try to prevent the spread of the infection.  These instructions are modified recommendations from the [facility] of Health and [employer].  People who might have COVID-19 should follow the instructions below until a doctor or health department says they can stop.  Stay home unless you need to see a doctor Stop doing things outside your home except for getting medical care. Do not go to work, school, or public areas; and do not use public transportation or taxis.  Call ahead before visiting the doctor Before your appointment, call the doctor's office and tell them about your symptoms. This will help them take steps to keep other people from getting infected.   Keep track of your symptoms Symptoms are the things you feel, like fever or trouble breathing. Go to your doctor or the ER if you think you are getting worse, like having more trouble breathing. Call the doctor's office and tell them about your symptoms. This will help them take steps to keep other  people from getting sick.   Wear a face mask You should wear a face mask that covers your nose and mouth when you are in the same room with other people and when you visit the doctor's office. People who live with you or visit you should also wear a face mask when they are in the same room with you.  Separate yourself from other people in your home You should stay in a different room from  other people in your home. You should stay separate from your family members as much as possible. You should use a separate bathroom if you can.  Avoid sharing things in your house Don't share dishes, drinking glasses, cups, eating utensils, towels, bedding, or other things with people in your home. After using these things please wash them really well with soap and water.  Cover your coughs and sneezes Cover your mouth and nose with a tissue when you cough or sneeze, or you can cough or sneeze into your sleeve. Throw used tissues in a trash can that has a bag in it, and immediately wash your hands with soap and water for at least 20 seconds. If you use an alcohol-based hand rub please rub your hands together for 20 seconds.  Wash your [employer] your hands often and very well with soap and water for at least 20 seconds. If your hands are not visibly dirty you can use an alcohol-based hand sanitizer. Don't touch your eyes, nose, or mouth with unwashed hands.    Instructions for People Helping Care for Patients with Possible COVID-19 Follow your doctor's instructions Make sure that you understand and can help the patient follow any instructions for care.  Provide for the patient's basic needs You should help the patient with basic needs in the home and provide support for getting groceries, prescriptions, and other personal needs.  Keep track of the patient's symptoms If they are getting sicker, call his or her doctor. This will help the doctor's office take steps to keep other people from getting infected.  Limit the number of people who have contact with the patient If possible, have only one caregiver for the patient. Other family members should stay in another home or place of residence. If they can't, they should stay in another room and stay separated from the patient as much as possible. Keep one bathroom JUST for the patient if you can.  Only allow visitors if they MUST be in the  home.  Keep older adults, very young children, and other sick people away from the patient Keep older adults, very young children, and people who have compromised immune systems or chronic health conditions away from the patient. This includes people with chronic heart, lung, or kidney conditions, diabetes, and cancer.  Wash your hands often Avoid touching your eyes, nose, and mouth with unwashed hands. Wash your hands often and thoroughly with soap and water for at least 20 seconds. You can use an alcohol-based hand sanitizer if soap and water are not available and if your hands are not visibly dirty.  Use disposable paper towels to dry your hands. If not available, use dedicated cloth towels and replace them when they become wet.  Avoid contamination from face masks and gloves Wear a disposable face mask and gloves whenever you are touching the patient, things in their room or bathroom, or things that can have their body fluid on them, like bedding or dishes, or blood, vomit, urine, or feces (poop).  Ensure the  mask fits over your nose and mouth tightly, and do not touch it at all while you are wearing it. Throw out disposable facemasks and gloves after using them. Do not reuse. Wash your hands immediately after removing your facemask and gloves. If your personal clothing becomes dirty with a patient's body fluids, carefully remove clothing and launder. Wash your hands after handling dirty clothing. Place all used disposable facemasks, gloves, and other waste in a lined container before disposing them with other household garbage.  Remove gloves and wash your hands immediately after handling these items.  Do not share dishes, glasses, or other household items with the patient Avoid sharing household items. You should not share dishes, drinking glasses, cups, eating utensils, towels, bedding, or other items with a patient who is confirmed to have, or being evaluated for, COVID-19 infection.    After the person uses these items, you should wash them very well with soap and water.  Wash laundry thoroughly Immediately remove and wash clothes or bedding that have blood, body fluids, and/or secretions or excretions, such as sweat, saliva, sputum, nasal mucus, vomit, urine, or feces, on them.  Wear gloves when handling laundry from the patient.  Read and follow directions on labels of laundry or clothing items and detergent. In general, wash and dry with the warmest temperatures recommended on the label.  Clean all areas the individual has used  Clean all touchable surfaces, such as counters, tabletops, doorknobs, bathroom fixtures, toilets, phones, keyboards, tablets, and bedside tables, every day. Also, clean any surfaces that may have blood, body fluids, and/or secretions or excretions on them. Wear gloves when cleaning surfaces the patient has come in contact with.  Use a diluted bleach solution (dilute bleach with 1 part bleach and 10 parts water) or a household disinfectant with a label that says EPA-registered for coronaviruses. To make a bleach solution at home, add 1 tablespoon of bleach to 1 quart (4 cups) of water. For a larger supply, add  cup of bleach to 1 gallon (16 cups) of water.  Read labels of cleaning products and follow recommendations provided on product labels. Labels contain instructions for safe and effective use of the cleaning product including precautions you should take when applying the product, such as wearing gloves or eye protection and making sure you have good ventilation during use of the product.  Remove gloves and wash hands immediately after cleaning.  Monitor yourself for signs and symptoms of illness Caregivers and household members are considered close contacts, should monitor their health, and will be asked to limit movement outside of the home as much as possible.   If you have additional questions Geneticist, molecularContact  Your Doctor or Healthcare Provider The  [facility] website ([email or web address]) [facility] COVID hotline 336-70COVID Your local health department   This guidance is subject to change. For the most up to date guidance, check the state Department of Health website at [place] [email or web address]

## 2019-05-18 NOTE — ED Provider Notes (Signed)
[facility] Emergency Department Provider Note    First MD Initiated Contact with Patient 05/18/19 1615     (approximate)  I have reviewed the triage vital signs and the nursing notes.   HISTORY  Chief Complaint Fever and Loss of Consciousness    HPI Devon Roach is a 28 y.o. male presents the ER for evaluation of fevers headaches muscle aches and malaise for the past 48 hours.  States that on Wednesday he even had a syncopal episode felt lightheaded and fall and hit the right side of his head.  He denied blood thinners.  Is not having nausea or vomiting.  No cough or shortness of breath.  No previous illnesses.  Denies any substance abuse.    History reviewed. No pertinent past medical history. Family History  Problem Relation Age of Onset  . Hypertension Father    History reviewed. No pertinent surgical history. Patient Active Problem List   Diagnosis Date Noted  . Essential hypertension 03/06/2019  . Obesity (BMI 30-39.9) 03/06/2019      Prior to Admission medications   Medication Sig Start Date End Date Taking? Authorizing Provider  amoxicillin-clavulanate (AUGMENTIN) 875-125 MG tablet Take 1 tablet by mouth 2 (two) times daily. For 10 days 03/06/19   Smitty Cords, DO  azithromycin (ZITHROMAX) 500 MG tablet Take 1 tablet (500 mg total) by mouth daily for 4 days. Take 1 tablet daily for 3 days. 05/18/19 05/22/19  Willy Eddy, MD  butalbital-acetaminophen-caffeine (FIORICET) [phone] MG tablet Take 1 tablet by mouth every 6 (six) hours as needed for headache. 05/18/19 05/17/20  Willy Eddy, MD    Allergies Patient has no known allergies.    Social History Social History   Tobacco Use  . Smoking status: Never Smoker  . Smokeless tobacco: Never Used  Substance Use Topics  . Alcohol use: Yes    Alcohol/week: 12.0 standard drinks    Types: 12 Cans of beer per week  . Drug use: Yes    Types: Marijuana    Review of  Systems Patient denies headaches, rhinorrhea, blurry vision, numbness, shortness of breath, chest pain, edema, cough, abdominal pain, nausea, vomiting, diarrhea, dysuria, fevers, rashes or hallucinations unless otherwise stated above in HPI. ____________________________________________   PHYSICAL EXAM:  VITAL SIGNS: Vitals:   05/18/19 1615 05/18/19 1919  BP:  139/86  Pulse:  95  Resp:  18  Temp: (!) 101.5 F (38.6 C)   SpO2:  99%    Constitutional: Alert and oriented. Nontoxic appearing  Eyes: Conjunctivae are normal.  Head: Atraumatic. Nose: No congestion/rhinnorhea. Mouth/Throat: Mucous membranes are moist.   Neck: No stridor. Painless ROM.  Cardiovascular: mildly tachycardic, regular rhythm. Grossly normal heart sounds.  Good peripheral circulation. Respiratory: Normal respiratory effort.  No retractions. Lungs CTAB. Gastrointestinal: Soft and nontender. No distention. No abdominal bruits. No CVA tenderness. Genitourinary: deferred Musculoskeletal: No lower extremity tenderness nor edema.  No joint effusions. Neurologic:  Normal speech and language. No gross focal neurologic deficits are appreciated. No facial droop Skin:  Skin is warm, dry and intact. No rash noted. Psychiatric: Mood and affect are normal. Speech and behavior are normal.  ____________________________________________   LABS (all labs ordered are listed, but only abnormal results are displayed)  Results for orders placed or performed during the hospital encounter of 05/18/19 (from the past 24 hour(s))  CBC with Differential/Platelet     Status: Abnormal   Collection Time: 05/18/19  4:43 PM  Result Value Ref Range  WBC 9.2 4.0 - 10.5 K/uL   RBC 5.06 4.22 - 5.81 MIL/uL   Hemoglobin 15.7 13.0 - 17.0 g/dL   HCT 16.1 09.6 - 04.5 %   MCV 88.5 80.0 - 100.0 fL   MCH 31.0 26.0 - 34.0 pg   MCHC 35.0 30.0 - 36.0 g/dL   RDW 40.9 81.1 - 91.4 %   Platelets 266 150 - 400 K/uL   nRBC 0.0 0.0 - 0.2 %    Neutrophils Relative % 76 %   Neutro Abs 6.9 1.7 - 7.7 K/uL   Lymphocytes Relative 9 %   Lymphs Abs 0.8 0.7 - 4.0 K/uL   Monocytes Relative 15 %   Monocytes Absolute 1.3 (H) 0.1 - 1.0 K/uL   Eosinophils Relative 0 %   Eosinophils Absolute 0.0 0.0 - 0.5 K/uL   Basophils Relative 0 %   Basophils Absolute 0.0 0.0 - 0.1 K/uL   Immature Granulocytes 0 %   Abs Immature Granulocytes 0.01 0.00 - 0.07 K/uL  Comprehensive metabolic panel     Status: Abnormal   Collection Time: 05/18/19  4:43 PM  Result Value Ref Range   Sodium 138 135 - 145 mmol/L   Potassium 3.9 3.5 - 5.1 mmol/L   Chloride 101 98 - 111 mmol/L   CO2 24 22 - 32 mmol/L   Glucose, Bld 92 70 - 99 mg/dL   BUN 13 6 - 20 mg/dL   Creatinine, Ser 7.82 0.61 - 1.24 mg/dL   Calcium 9.7 8.9 - 95.6 mg/dL   Total Protein 8.2 (H) 6.5 - 8.1 g/dL   Albumin 5.0 3.5 - 5.0 g/dL   AST 24 15 - 41 U/L   ALT 36 0 - 44 U/L   Alkaline Phosphatase 56 38 - 126 U/L   Total Bilirubin 0.7 0.3 - 1.2 mg/dL   GFR calc non Af Amer >60 >60 mL/min   GFR calc Af Amer >60 >60 mL/min   Anion gap 13 5 - 15  SARS Coronavirus 2 (CEPHEID- Performed in [facility] hospital lab), Hosp Order     Status: Abnormal   Collection Time: 05/18/19  6:08 PM  Result Value Ref Range   SARS Coronavirus 2 POSITIVE (A) NEGATIVE  Influenza panel by PCR (type A & B)     Status: None   Collection Time: 05/18/19  6:08 PM  Result Value Ref Range   Influenza A By PCR NEGATIVE NEGATIVE   Influenza B By PCR NEGATIVE NEGATIVE   ____________________________________________  EKG My review and personal interpretation at Time: 16:14   Indication: syncope  Rate: 120  Rhythm: sinus Axis: normal Other: normal intervals, no stemi, no wpw or brugada ____________________________________________  RADIOLOGY  I personally reviewed all radiographic images ordered to evaluate for the above acute complaints and reviewed radiology reports and findings.  These findings were personally discussed  with the patient.  Please see medical record for radiology report.  ____________________________________________   PROCEDURES  Procedure(s) performed:  Procedures    Critical Care performed: no ____________________________________________   INITIAL IMPRESSION / ASSESSMENT AND PLAN / ED COURSE  Pertinent labs & imaging results that were available during my care of the patient were reviewed by me and considered in my medical decision making (see chart for details).   DDX: covid 19, pna, uti, flu, ili, meningitis  Devon Roach is a 28 y.o. who presents to the ED with symptoms as described above.  Patient febrile tachycardic plan headache.  Blood work and diagnostic tests will  be sent for the by differential.  Given his syncopal episode and headache will order CT imaging to exclude traumatic injury or other acute intracranial abnormality.  Will provide IV fluids as well as pain medication.  Clinical Course as of May 17 1925  Fri May 18, 2019  1836 Patients headache resolved.  No neck stiffness.  Have low suspicion for meningitis.  CT imaging reported with sinusitis.  Will give abx and po challenge.   [PR]  1922 Patient's coronavirus test is positive.  Symptoms have improved and patient is requesting discharge.  This point do believe that is appropriate.  Do not feel that lumbar puncture clinically indicated given absence of meningismus.  Discussed signs and symptoms which patient should return to the ER.   [PR]    Clinical Course User Index [PR] Willy Eddyobinson, Braelon Sprung, MD    The patient was evaluated in Emergency Department today for the symptoms described in the history of present illness. He/she was evaluated in the context of the global COVID-19 pandemic, which necessitated consideration that the patient might be at risk for infection with the SARS-CoV-2 virus that causes COVID-19. Institutional protocols and algorithms that pertain to the evaluation of patients at risk for COVID-19 are  in a state of rapid change based on information released by regulatory bodies including the CDC and federal and state organizations. These policies and algorithms were followed during the patient's care in the ED.  As part of my medical decision making, I reviewed the following data within the electronic MEDICAL RECORD NUMBER Nursing notes reviewed and incorporated, Labs reviewed, notes from prior ED visits and [place] Controlled Substance Database   ____________________________________________   FINAL CLINICAL IMPRESSION(S) / ED DIAGNOSES  Final diagnoses:  COVID-19 virus infection  Bad headache  Ethmoid sinusitis, unspecified chronicity      NEW MEDICATIONS STARTED DURING THIS VISIT:  New Prescriptions   AZITHROMYCIN (ZITHROMAX) 500 MG TABLET    Take 1 tablet (500 mg total) by mouth daily for 4 days. Take 1 tablet daily for 3 days.   BUTALBITAL-ACETAMINOPHEN-CAFFEINE (FIORICET) 50-325-40 MG TABLET    Take 1 tablet by mouth every 6 (six) hours as needed for headache.     Note:  This document was prepared using Dragon voice recognition software and may include unintentional dictation errors.    Willy Eddyobinson, Henrique Parekh, MD 05/18/19 [phone]

## 2019-05-18 NOTE — ED Notes (Signed)
Called by CT stating pt was very anxious and would not keep mask in place and that the IV was pulled by pt, CT states they repositioned and retaped, but that they could not get pt until he was able to keep mask in place.  Will discuss anxiety issues with pt and reassess pt needs.

## 2019-05-18 NOTE — ED Notes (Signed)
IV in place, no redness or swelling noted, Fluids have infused.  Pt states he is feeling better at this time and is able to go to CT.

## 2019-06-05 ENCOUNTER — Other Ambulatory Visit: Payer: No Typology Code available for payment source

## 2019-06-12 ENCOUNTER — Encounter: Payer: No Typology Code available for payment source | Admitting: Family Medicine

## 2019-10-01 IMAGING — DX PORTABLE CHEST - 1 VIEW
1 series · 1 of 1 positions shown · non-contrast
Comparison: None.

CLINICAL DATA: Loss of consciousness and fever

EXAM:
PORTABLE CHEST 1 VIEW

[chest ap]
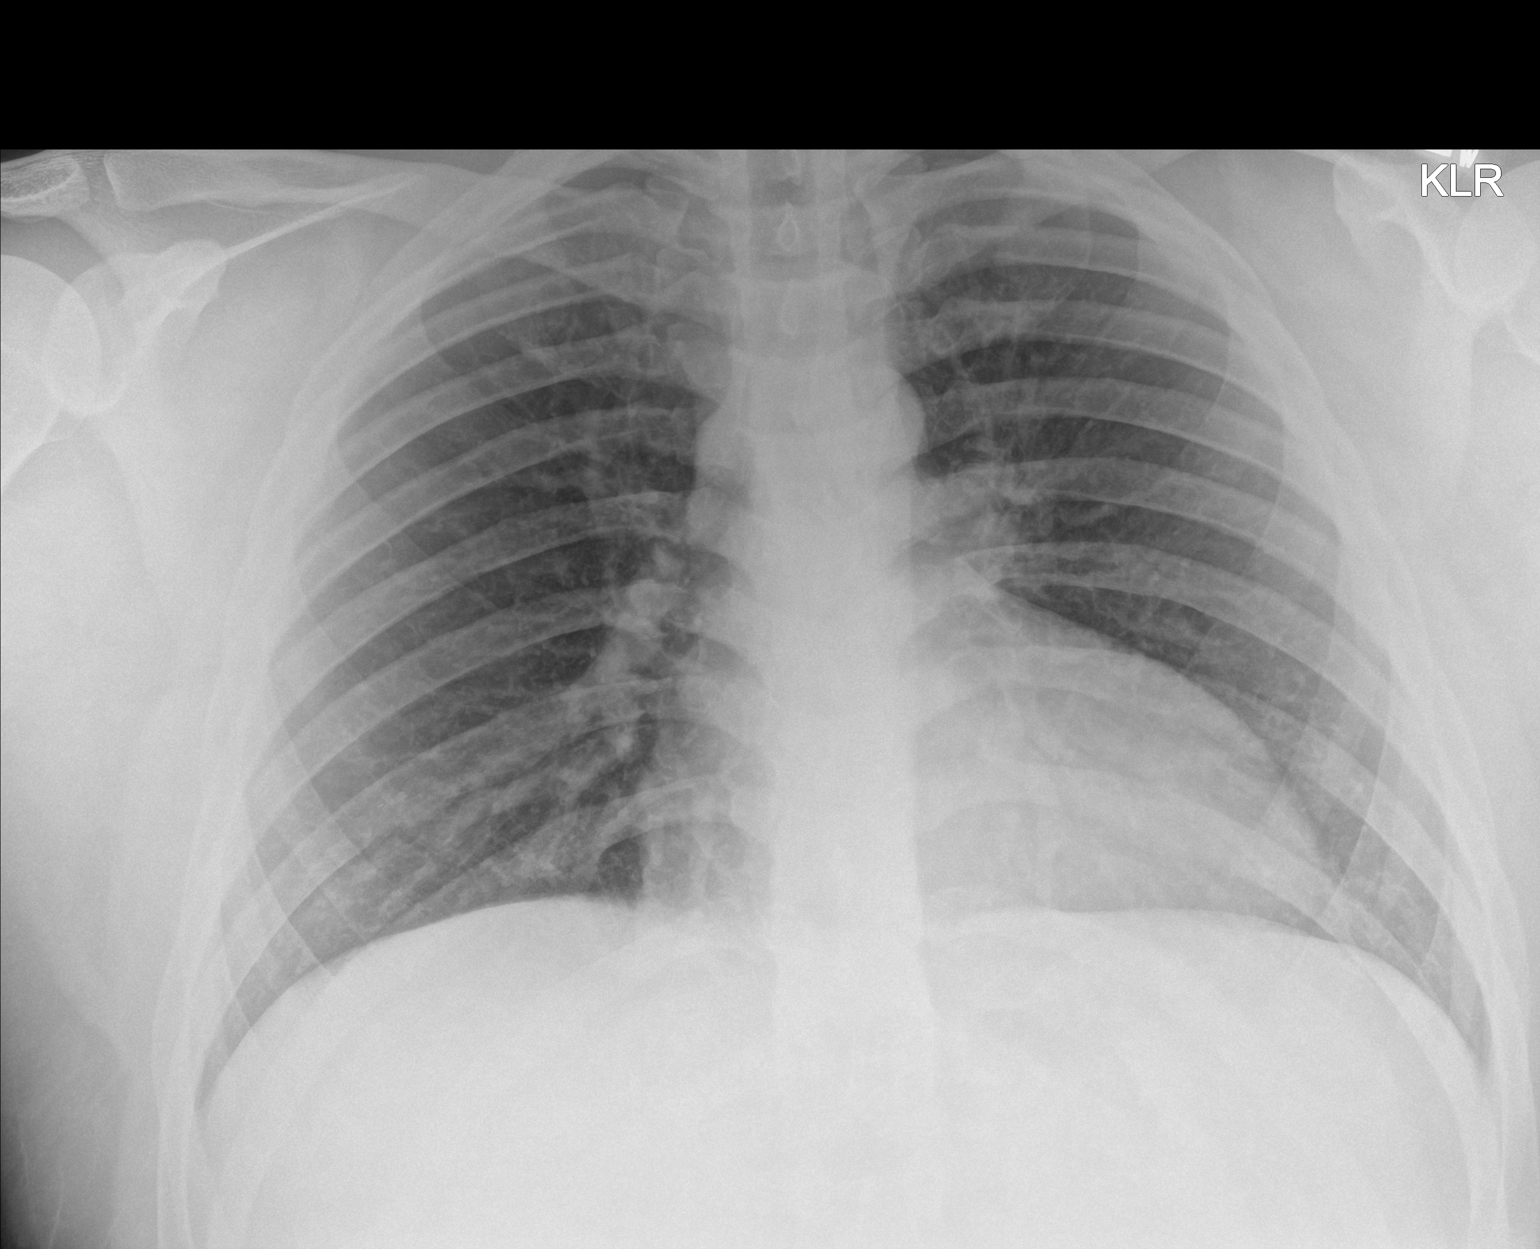

[1 of 1 positions shown; findings below may reference images not displayed]

FINDINGS: The heart size and mediastinal contours are within normal limits.
Both lungs are clear. The visualized skeletal structures are
unremarkable.
IMPRESSION: No active disease.

## 2019-10-01 IMAGING — CT CT HEAD WITHOUT CONTRAST
3 series · 15 of 47 positions shown, 18 images · non-contrast
Comparison: None.

CLINICAL DATA: Headache and fever.  Recent syncope

EXAM:
CT HEAD WITHOUT CONTRAST
TECHNIQUE: Contiguous axial images were obtained from the base of the skull
through the vertex without intravenous contrast.

[Series 2: head wo · axial · 0.47mm/px · z∈[-102,+23]mm · 9 of 31 slices shown, 12 images]
[im 3/31  brain]
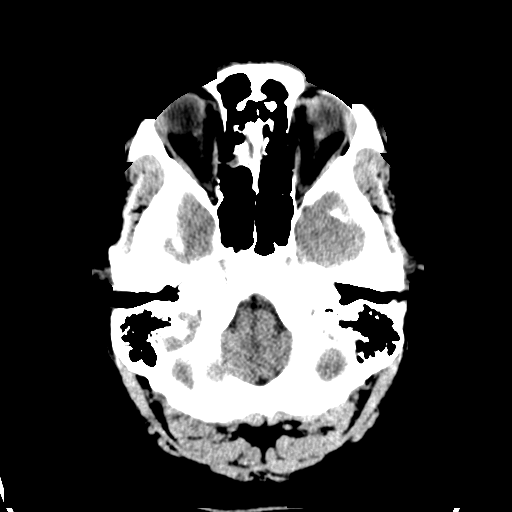
[im 3/31  bone]
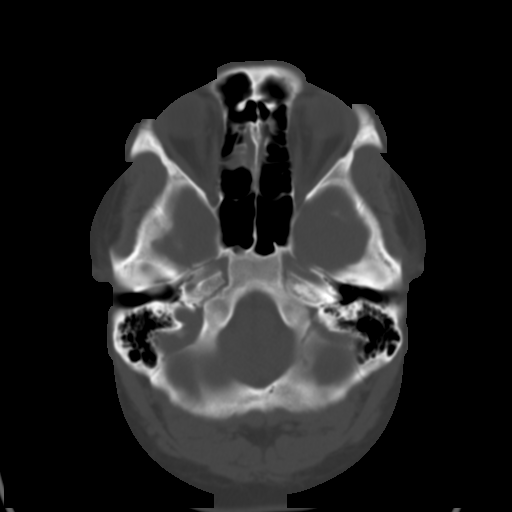
[im 6/31  brain]
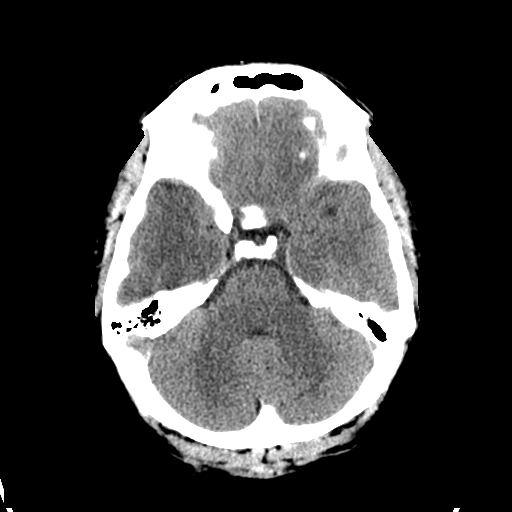
[im 9/31  brain]
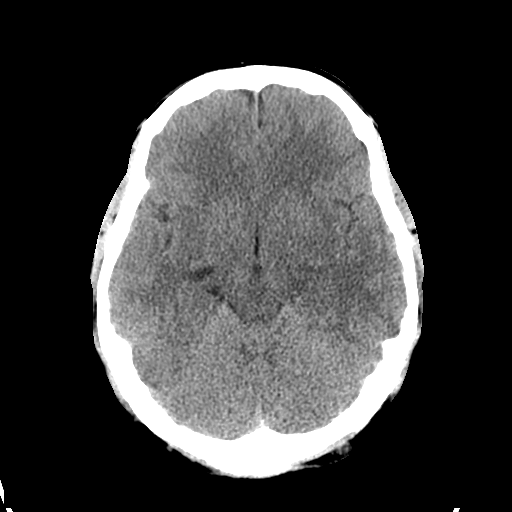
[im 12/31  brain]
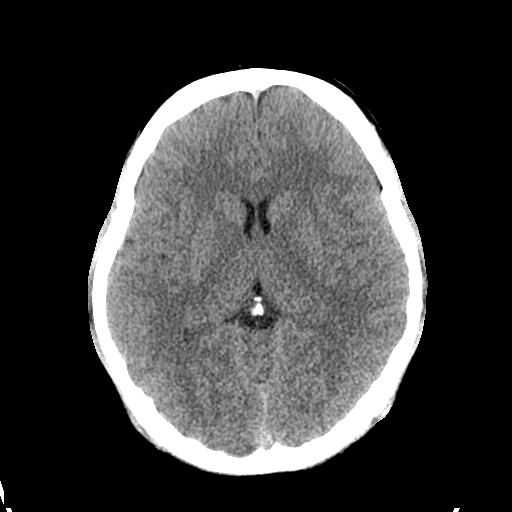
[im 16/31  brain]
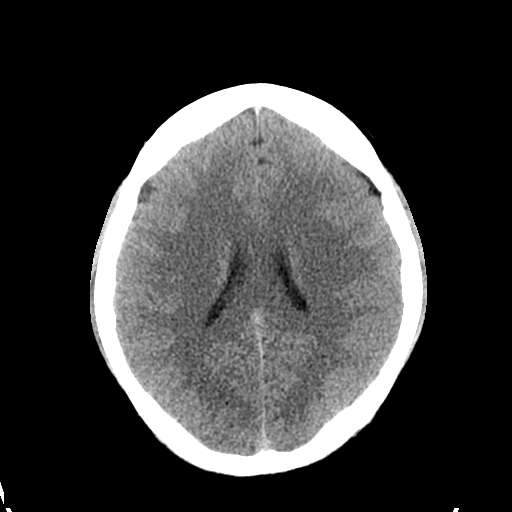
[im 16/31  bone]
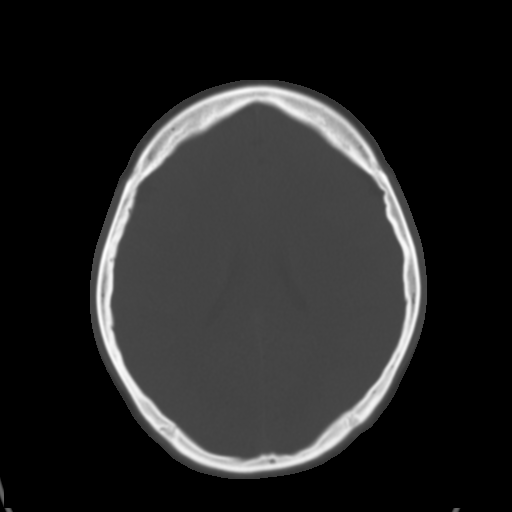
[im 19/31  brain]
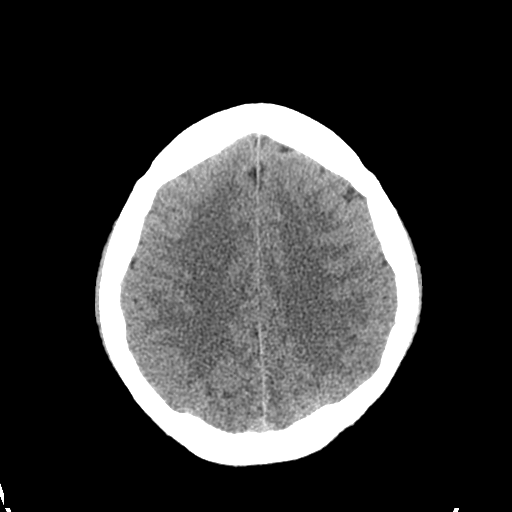
[im 22/31  brain]
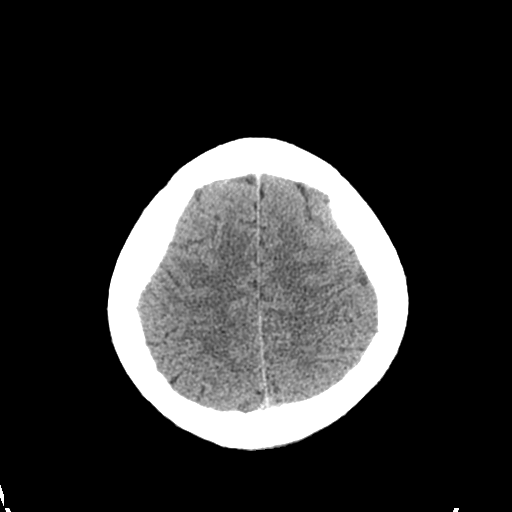
[im 25/31  brain]
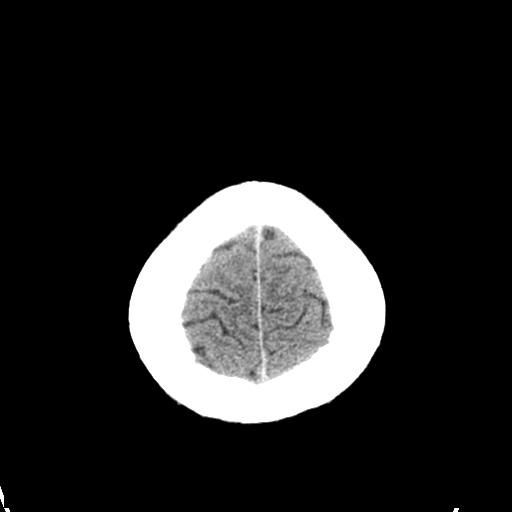
[im 28/31  brain]
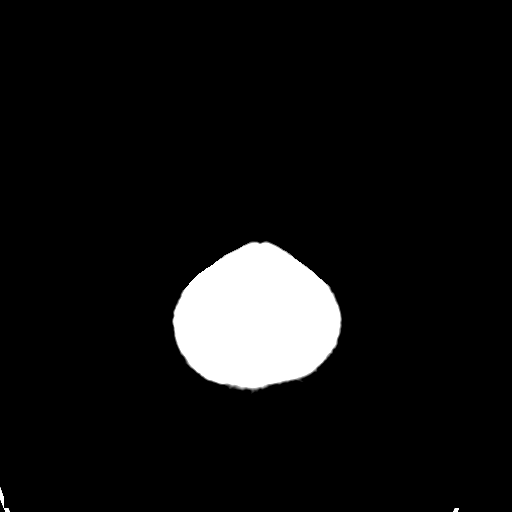
[im 28/31  bone]
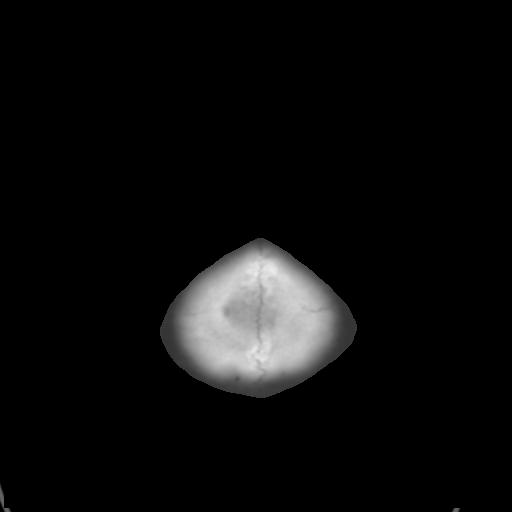

[Series 4: coronal soft tissue · coronal · 0.31mm/px · 3 of 69 slices shown]
[im 23/69  brain]
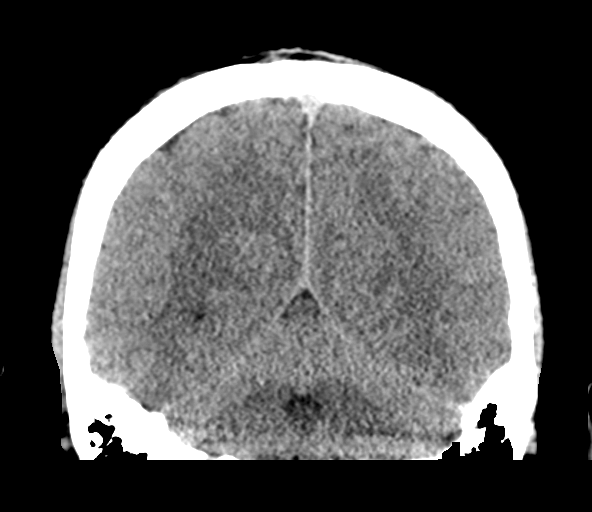
[im 31/69  brain]
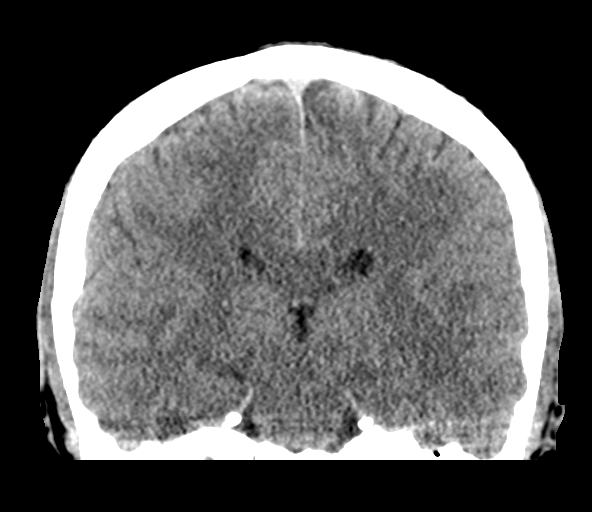
[im 38/69  brain]
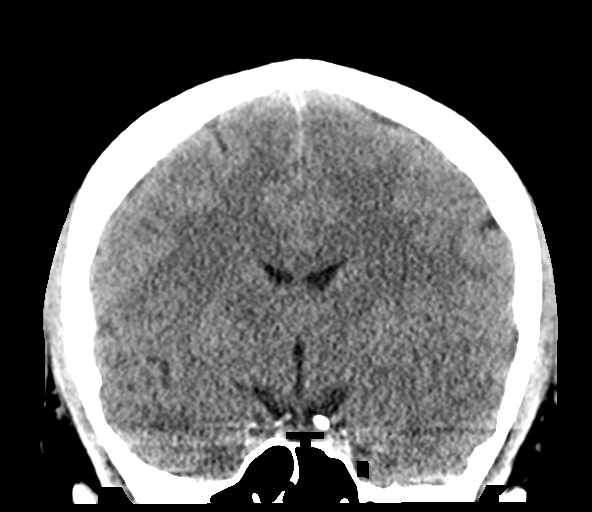

[Series 5: sagittal soft tissue · sagittal · 0.31mm/px · 3 of 62 slices shown]
[im 21/62  brain]
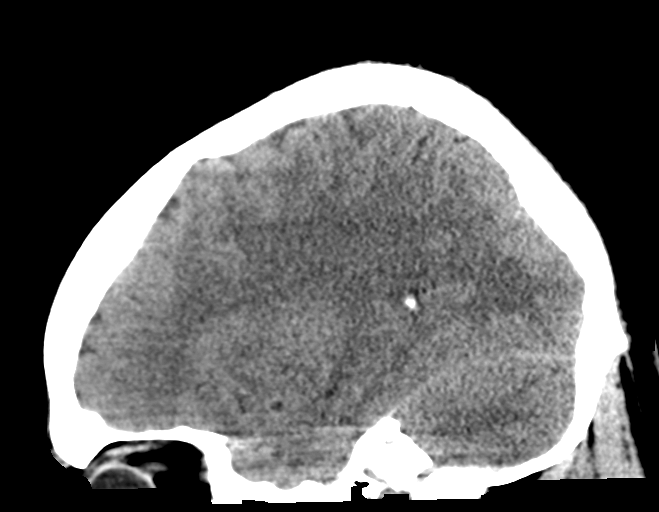
[im 31/62  brain]
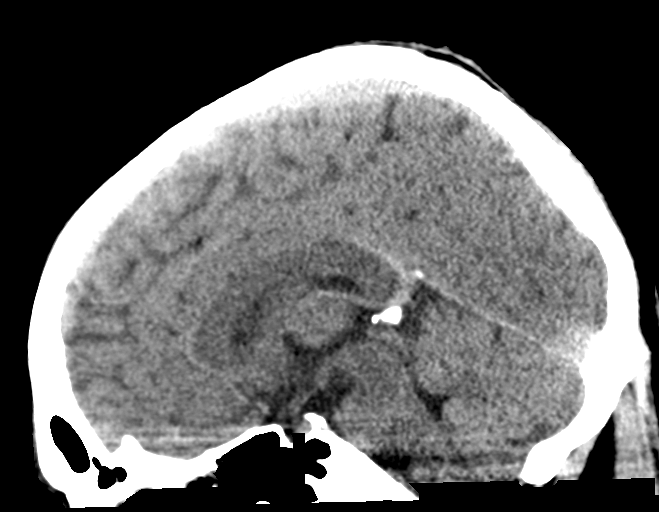
[im 41/62  brain]
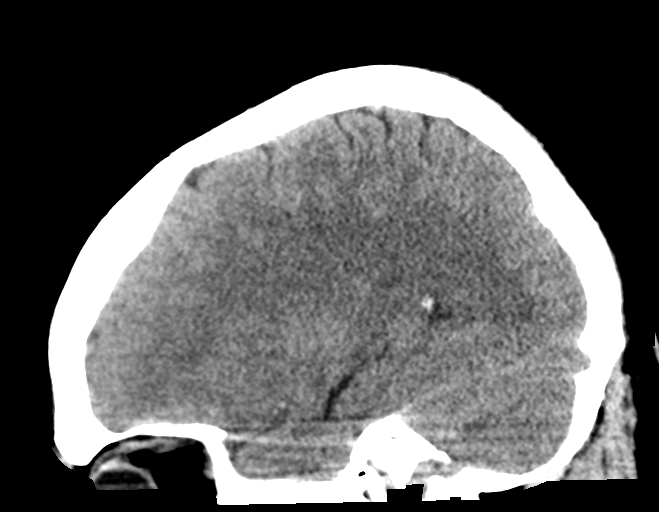

[15 of 47 positions shown; findings below may reference images not displayed]

FINDINGS: Brain: The ventricles are normal in size and configuration. There is
no intracranial mass, hemorrhage, extra-axial fluid collection, or
midline shift. Brain parenchyma appears unremarkable. No acute
infarct evident.

Vascular: No hyperdense vessel. There is no appreciable vascular
calcification.

Skull: The bony calvarium appears intact.

Sinuses/Orbits: There is opacification of multiple ethmoid air
cells. Other visualized paranasal sinuses are clear. Visualized
orbits appear symmetric bilaterally.

Other: Visualized mastoid air cells are clear.
IMPRESSION: Extensive opacification in multiple ethmoid air cells. Study
otherwise unremarkable.

## 2024-04-05 ENCOUNTER — Encounter: Payer: Self-pay | Admitting: Internal Medicine

## 2024-04-05 ENCOUNTER — Ambulatory Visit: Admitting: Internal Medicine

## 2024-04-05 VITALS — BP 140/80 | HR 111 | Ht 70.0 in | Wt 302.4 lb

## 2024-04-05 DIAGNOSIS — E66813 Obesity, class 3: Secondary | ICD-10-CM

## 2024-04-05 DIAGNOSIS — R739 Hyperglycemia, unspecified: Secondary | ICD-10-CM

## 2024-04-05 DIAGNOSIS — Z136 Encounter for screening for cardiovascular disorders: Secondary | ICD-10-CM | POA: Diagnosis not present

## 2024-04-05 DIAGNOSIS — I10 Essential (primary) hypertension: Secondary | ICD-10-CM

## 2024-04-05 DIAGNOSIS — Z114 Encounter for screening for human immunodeficiency virus [HIV]: Secondary | ICD-10-CM | POA: Diagnosis not present

## 2024-04-05 DIAGNOSIS — E6609 Other obesity due to excess calories: Secondary | ICD-10-CM | POA: Insufficient documentation

## 2024-04-05 DIAGNOSIS — F418 Other specified anxiety disorders: Secondary | ICD-10-CM | POA: Insufficient documentation

## 2024-04-05 DIAGNOSIS — Z1159 Encounter for screening for other viral diseases: Secondary | ICD-10-CM | POA: Diagnosis not present

## 2024-04-05 DIAGNOSIS — Z6841 Body Mass Index (BMI) 40.0 and over, adult: Secondary | ICD-10-CM | POA: Insufficient documentation

## 2024-04-05 DIAGNOSIS — E7800 Pure hypercholesterolemia, unspecified: Secondary | ICD-10-CM | POA: Insufficient documentation

## 2024-04-05 NOTE — Assessment & Plan Note (Signed)
 Uncontrolled off meds however he would like to work on lifestyle changes Reinforced DASH diet and exercise for weight loss C-Met today

## 2024-04-05 NOTE — Progress Notes (Signed)
 Subjective:    Patient ID: Devon Roach, male    DOB: 10-25-1991, 33 y.o.   MRN: [id number]  HPI  Patient presents to clinic today to establish care and for management of the conditions listed below.  HTN: His BP today is 140/92.  He is not taking any antihypertensive medications at this time but has been prescribed lisinopril in the past.  Anxiety: Situational.  He is not currently taking any medications for this at this time but has been prescribed escitalopram in the past.  He is not currently seeing a therapist.  He denies depression, SI/HI.  HLD: His last LDL was 114, triglycerides 125, 2017.  He is not taking any cholesterol-lowering medication at this time.  He does not consume a low-fat diet.  Review of Systems   No past medical history on file.  Current Outpatient Medications  Medication Sig Dispense Refill   amoxicillin-clavulanate (AUGMENTIN) 875-125 MG tablet Take 1 tablet by mouth 2 (two) times daily. For 10 days 20 tablet 0   No current facility-administered medications for this visit.    No Known Allergies  Family History  Problem Relation Age of Onset   Hypertension Father     Social History   Socioeconomic History   Marital status: Married    Spouse name: Not on file   Number of children: Not on file   Years of education: Not on file   Highest education level: Not on file  Occupational History   Not on file  Tobacco Use   Smoking status: Never   Smokeless tobacco: Never  Substance and Sexual Activity   Alcohol use: Yes    Alcohol/week: 12.0 standard drinks of alcohol    Types: 12 Cans of beer per week   Drug use: Yes    Types: Marijuana   Sexual activity: Not on file  Other Topics Concern   Not on file  Social History Narrative   Not on file   Social Drivers of Health   Financial Resource Strain: Not on file  Food Insecurity: Not on file  Transportation Needs: Not on file  Physical Activity: Not on file  Stress: Not on file  Social  Connections: Not on file  Intimate Partner Violence: Not on file     Constitutional: Denies fever, malaise, fatigue, headache or abrupt weight changes.  HEENT: Patient reports intermittent blurred vision.  Denies eye pain, eye redness, ear pain, ringing in the ears, wax buildup, runny nose, nasal congestion, bloody nose, or sore throat. Respiratory: Denies difficulty breathing, shortness of breath, cough or sputum production.   Cardiovascular: Denies chest pain, chest tightness, palpitations or swelling in the hands or feet.  Gastrointestinal: Denies abdominal pain, bloating, constipation, diarrhea or blood in the stool.  GU: Denies urgency, frequency, pain with urination, burning sensation, blood in urine, odor or discharge. Musculoskeletal: Denies decrease in range of motion, difficulty with gait, muscle pain or joint pain and swelling.  Skin: Denies redness, rashes, lesions or ulcercations.  Neurological: Denies dizziness, difficulty with memory, difficulty with speech or problems with balance and coordination.  Psych: Patient has a history of anxiety.  Denies  depression, SI/HI.  No other specific complaints in a complete review of systems (except as listed in HPI above).      Objective:   Physical Exam  BP (!) 140/80   Pulse (!) 111   Ht 5\' 10"  (1.778 m)   Wt (!) 302 lb 6 oz (137.2 kg)   BMI 43.39 kg/m  Wt Readings from Last 3 Encounters:  05/18/19 275 lb (124.7 kg)  03/06/19 276 lb 9.6 oz (125.5 kg)  09/29/16 278 lb 6.4 oz (126.3 kg)    General: Appears his stated age, obese, in NAD. Skin: Warm, dry and intact.  HEENT: Head: normal shape and size; Eyes: sclera white, no icterus, conjunctiva pink, PERRLA and EOMs intact;  Cardiovascular: Normal rate and rhythm. S1,S2 noted.  No murmur, rubs or gallops noted. No JVD or BLE edema.  Pulmonary/Chest: Normal effort and positive vesicular breath sounds. No respiratory distress. No wheezes, rales or ronchi noted.   Musculoskeletal:  No difficulty with gait.  Neurological: Alert and oriented. Coordination normal.  Psychiatric: Mood and affect normal. Behavior is normal. Judgment and thought content normal.    BMET    Component Value Date/Time   NA 138 05/18/2019 1643   K 3.9 05/18/2019 1643   CL 101 05/18/2019 1643   CO2 24 05/18/2019 1643   GLUCOSE 92 05/18/2019 1643   BUN 13 05/18/2019 1643   CREATININE 0.97 05/18/2019 1643   CALCIUM 9.7 05/18/2019 1643   GFRNONAA >60 05/18/2019 1643   GFRAA >60 05/18/2019 1643    Lipid Panel     Component Value Date/Time   CHOL 170 09/30/2016 0402   TRIG 125 09/30/2016 0402   HDL 31 (L) 09/30/2016 0402   CHOLHDL 5.5 09/30/2016 0402   VLDL 25 09/30/2016 0402   LDLCALC 114 (H) 09/30/2016 0402    CBC    Component Value Date/Time   WBC 9.2 05/18/2019 1643   RBC 5.06 05/18/2019 1643   HGB 15.7 05/18/2019 1643   HCT 44.8 05/18/2019 1643   PLT 266 05/18/2019 1643   MCV 88.5 05/18/2019 1643   MCH 31.0 05/18/2019 1643   MCHC 35.0 05/18/2019 1643   RDW 12.8 05/18/2019 1643   LYMPHSABS 0.8 05/18/2019 1643   MONOABS 1.3 (H) 05/18/2019 1643   EOSABS 0.0 05/18/2019 1643   BASOSABS 0.0 05/18/2019 1643    Hgb A1C Lab Results  Component Value Date   HGBA1C 5.4 09/29/2016            Assessment & Plan:   RTC in 6 months for your annual exam Nicki Reaper, NP

## 2024-04-05 NOTE — Assessment & Plan Note (Signed)
 Encouraged diet and exercise for weight loss ?

## 2024-04-05 NOTE — Patient Instructions (Signed)
 Hypertension, Adult High blood pressure (hypertension) is when the force of blood pumping through the arteries is too strong. The arteries are the blood vessels that carry blood from the heart throughout the body. Hypertension forces the heart to work harder to pump blood and may cause arteries to become narrow or stiff. Untreated or uncontrolled hypertension can lead to a heart attack, heart failure, a stroke, kidney disease, and other problems. A blood pressure reading consists of a higher number over a lower number. Ideally, your blood pressure should be below 120/80. The first ("top") number is called the systolic pressure. It is a measure of the pressure in your arteries as your heart beats. The second ("bottom") number is called the diastolic pressure. It is a measure of the pressure in your arteries as the heart relaxes. What are the causes? The exact cause of this condition is not known. There are some conditions that result in high blood pressure. What increases the risk? Certain factors may make you more likely to develop high blood pressure. Some of these risk factors are under your control, including: Smoking. Not getting enough exercise or physical activity. Being overweight. Having too much fat, sugar, calories, or salt (sodium) in your diet. Drinking too much alcohol. Other risk factors include: Having a personal history of heart disease, diabetes, high cholesterol, or kidney disease. Stress. Having a family history of high blood pressure and high cholesterol. Having obstructive sleep apnea. Age. The risk increases with age. What are the signs or symptoms? High blood pressure may not cause symptoms. Very high blood pressure (hypertensive crisis) may cause: Headache. Fast or irregular heartbeats (palpitations). Shortness of breath. Nosebleed. Nausea and vomiting. Vision changes. Severe chest pain, dizziness, and seizures. How is this diagnosed? This condition is diagnosed by  measuring your blood pressure while you are seated, with your arm resting on a flat surface, your legs uncrossed, and your feet flat on the floor. The cuff of the blood pressure monitor will be placed directly against the skin of your upper arm at the level of your heart. Blood pressure should be measured at least twice using the same arm. Certain conditions can cause a difference in blood pressure between your right and left arms. If you have a high blood pressure reading during one visit or you have normal blood pressure with other risk factors, you may be asked to: Return on a different day to have your blood pressure checked again. Monitor your blood pressure at home for 1 week or longer. If you are diagnosed with hypertension, you may have other blood or imaging tests to help your health care provider understand your overall risk for other conditions. How is this treated? This condition is treated by making healthy lifestyle changes, such as eating healthy foods, exercising more, and reducing your alcohol intake. You may be referred for counseling on a healthy diet and physical activity. Your health care provider may prescribe medicine if lifestyle changes are not enough to get your blood pressure under control and if: Your systolic blood pressure is above 130. Your diastolic blood pressure is above 80. Your personal target blood pressure may vary depending on your medical conditions, your age, and other factors. Follow these instructions at home: Eating and drinking  Eat a diet that is high in fiber and potassium, and low in sodium, added sugar, and fat. An example of this eating plan is called the DASH diet. DASH stands for Dietary Approaches to Stop Hypertension. To eat this way: Eat  plenty of fresh fruits and vegetables. Try to fill one half of your plate at each meal with fruits and vegetables. Eat whole grains, such as whole-wheat pasta, brown rice, or whole-grain bread. Fill about one  fourth of your plate with whole grains. Eat or drink low-fat dairy products, such as skim milk or low-fat yogurt. Avoid fatty cuts of meat, processed or cured meats, and poultry with skin. Fill about one fourth of your plate with lean proteins, such as fish, chicken without skin, beans, eggs, or tofu. Avoid pre-made and processed foods. These tend to be higher in sodium, added sugar, and fat. Reduce your daily sodium intake. Many people with hypertension should eat less than 1,500 mg of sodium a day. Do not drink alcohol if: Your health care provider tells you not to drink. You are pregnant, may be pregnant, or are planning to become pregnant. If you drink alcohol: Limit how much you have to: 0-1 drink a day for women. 0-2 drinks a day for men. Know how much alcohol is in your drink. In the [place] one drink equals one 12 oz bottle of beer (355 mL), one 5 oz glass of wine (148 mL), or one 1 oz glass of hard liquor (44 mL). Lifestyle  Work with your health care provider to maintain a healthy body weight or to lose weight. Ask what an ideal weight is for you. Get at least 30 minutes of exercise that causes your heart to beat faster (aerobic exercise) most days of the week. Activities may include walking, swimming, or biking. Include exercise to strengthen your muscles (resistance exercise), such as Pilates or lifting weights, as part of your weekly exercise routine. Try to do these types of exercises for 30 minutes at least 3 days a week. Do not use any products that contain nicotine or tobacco. These products include cigarettes, chewing tobacco, and vaping devices, such as e-cigarettes. If you need help quitting, ask your health care provider. Monitor your blood pressure at home as told by your health care provider. Keep all follow-up visits. This is important. Medicines Take over-the-counter and prescription medicines only as told by your health care provider. Follow directions carefully. Blood  pressure medicines must be taken as prescribed. Do not skip doses of blood pressure medicine. Doing this puts you at risk for problems and can make the medicine less effective. Ask your health care provider about side effects or reactions to medicines that you should watch for. Contact a health care provider if you: Think you are having a reaction to a medicine you are taking. Have headaches that keep coming back (recurring). Feel dizzy. Have swelling in your ankles. Have trouble with your vision. Get help right away if you: Develop a severe headache or confusion. Have unusual weakness or numbness. Feel faint. Have severe pain in your chest or abdomen. Vomit repeatedly. Have trouble breathing. These symptoms may be an emergency. Get help right away. Call 911. Do not wait to see if the symptoms will go away. Do not drive yourself to the hospital. Summary Hypertension is when the force of blood pumping through your arteries is too strong. If this condition is not controlled, it may put you at risk for serious complications. Your personal target blood pressure may vary depending on your medical conditions, your age, and other factors. For most people, a normal blood pressure is less than 120/80. Hypertension is treated with lifestyle changes, medicines, or a combination of both. Lifestyle changes include losing weight, eating a healthy,  low-sodium diet, exercising more, and limiting alcohol. This information is not intended to replace advice given to you by your health care provider. Make sure you discuss any questions you have with your health care provider. Document Revised: 10/20/2021 Document Reviewed: 10/20/2021 Elsevier Patient Education  2024 [employer].

## 2024-04-05 NOTE — Assessment & Plan Note (Signed)
 C-Met lipid profile today Encouraged him to consume a low-fat diet

## 2024-04-05 NOTE — Assessment & Plan Note (Signed)
 Currently not an issue off medications Will monitor

## 2024-04-06 ENCOUNTER — Encounter: Payer: Self-pay | Admitting: Internal Medicine

## 2024-04-06 LAB — HIV ANTIBODY (ROUTINE TESTING W REFLEX): HIV 1&2 Ab, 4th Generation: NONREACTIVE

## 2024-04-06 LAB — COMPREHENSIVE METABOLIC PANEL WITH GFR
AG Ratio: 1.7 (calc) (ref 1.0–2.5)
ALT: 50 U/L — ABNORMAL HIGH (ref 9–46)
AST: 21 U/L (ref 10–40)
Albumin: 4.8 g/dL (ref 3.6–5.1)
Alkaline phosphatase (APISO): 62 U/L (ref 36–130)
BUN: 15 mg/dL (ref 7–25)
CO2: 25 mmol/L (ref 20–32)
Calcium: 10.2 mg/dL (ref 8.6–10.3)
Chloride: 105 mmol/L (ref 98–110)
Creat: 0.91 mg/dL (ref 0.60–1.26)
Globulin: 2.8 g/dL (ref 1.9–3.7)
Glucose, Bld: 100 mg/dL — ABNORMAL HIGH (ref 65–99)
Potassium: 4.7 mmol/L (ref 3.5–5.3)
Sodium: 139 mmol/L (ref 135–146)
Total Bilirubin: 0.6 mg/dL (ref 0.2–1.2)
Total Protein: 7.6 g/dL (ref 6.1–8.1)
eGFR: 116 mL/min/{1.73_m2} (ref >=60)

## 2024-04-06 LAB — CBC
HCT: 46.6 % (ref 38.5–50.0)
Hemoglobin: 16.1 g/dL (ref 13.2–17.1)
MCH: 30.8 pg (ref 27.0–33.0)
MCHC: 34.5 g/dL (ref 32.0–36.0)
MCV: 89.3 fL (ref 80.0–100.0)
MPV: 10.1 fL (ref 7.5–12.5)
Platelets: 393 10*3/uL (ref 140–400)
RBC: 5.22 10*6/uL (ref 4.20–5.80)
RDW: 12.8 % (ref 11.0–15.0)
WBC: 7.1 10*3/uL (ref 3.8–10.8)

## 2024-04-06 LAB — HEPATITIS C ANTIBODY: Hepatitis C Ab: NONREACTIVE

## 2024-04-06 LAB — LIPID PANEL
Cholesterol: 180 mg/dL (ref <200)
HDL: 34 mg/dL — ABNORMAL LOW (ref >=40)
LDL Cholesterol (Calc): 125 mg/dL — ABNORMAL HIGH
Non-HDL Cholesterol (Calc): 146 mg/dL — ABNORMAL HIGH (ref <130)
Total CHOL/HDL Ratio: 5.3 (calc) — ABNORMAL HIGH (ref <5.0)
Triglycerides: 106 mg/dL (ref <150)

## 2024-04-06 LAB — HEMOGLOBIN A1C
Hgb A1c MFr Bld: 5.7 %{Hb} — ABNORMAL HIGH
Mean Plasma Glucose: 117 mg/dL
eAG (mmol/L): 6.5 mmol/L

## 2024-05-02 ENCOUNTER — Telehealth: Payer: Self-pay

## 2024-05-02 ENCOUNTER — Other Ambulatory Visit: Payer: Self-pay | Admitting: Internal Medicine

## 2024-05-02 DIAGNOSIS — I10 Essential (primary) hypertension: Secondary | ICD-10-CM

## 2024-05-02 DIAGNOSIS — E66813 Obesity, class 3: Secondary | ICD-10-CM

## 2024-05-02 DIAGNOSIS — E7800 Pure hypercholesterolemia, unspecified: Secondary | ICD-10-CM

## 2024-05-02 MED ORDER — SEMAGLUTIDE-WEIGHT MANAGEMENT 0.25 MG/0.5ML ~~LOC~~ SOAJ: 0.2500 mg | SUBCUTANEOUS | 0 refills | Status: DC

## 2024-05-02 NOTE — Telephone Encounter (Signed)
 Devon Roach (Key: [id number]) Rx #: [id number] [phone] 0.25MG /0.5ML auto-injectors Form [employer] Electronic Request Form  Sent to plan with recent OV note and lab results   Approved 05/02/24-10/29/24

## 2024-05-25 MED ORDER — SEMAGLUTIDE-WEIGHT MANAGEMENT 0.5 MG/0.5ML ~~LOC~~ SOAJ: 0.5000 mg | SUBCUTANEOUS | 0 refills | Status: DC

## 2024-05-25 NOTE — Addendum Note (Signed)
 Addended by: Carollynn Cirri on: 05/25/2024 12:59 PM   Modules accepted: Orders

## 2024-06-17 ENCOUNTER — Other Ambulatory Visit: Payer: Self-pay | Admitting: Internal Medicine

## 2024-06-19 NOTE — Telephone Encounter (Signed)
 Requested Prescriptions  Pending Prescriptions Disp Refills   Semaglutide -Weight Management (WEGOVY ) 0.5 MG/0.5ML SOAJ [Pharmacy Med Name: WEGOVY  0.5 MG/0.5 ML PEN] 2 mL 0    Sig: INJECT 0.5 MG INTO THE SKIN ONE TIME PER WEEK     Endocrinology:  Diabetes - GLP-1 Receptor Agonists - semaglutide  Failed - 06/19/2024  1:51 PM      Failed - HBA1C in normal range and within 180 days    Hgb A1c MFr Bld  Date Value Ref Range Status  04/05/2024 5.7 (H) <5.7 % of total Hgb Final    Comment:    For someone without known diabetes, a hemoglobin  A1c value between 5.7% and 6.4% is consistent with prediabetes and should be confirmed with a  follow-up test. . For someone with known diabetes, a value <7% indicates that their diabetes is well controlled. A1c targets should be individualized based on duration of diabetes, age, comorbid conditions, and other considerations. . This assay result is consistent with an increased risk of diabetes. . Currently, no consensus exists regarding use of hemoglobin A1c for diagnosis of diabetes for children. .          Failed - Valid encounter within last 6 months    Recent Outpatient Visits           2 months ago Primary hypertension   [facility] [place], [place], [place]              Passed - Cr in normal range and within 360 days    Creat  Date Value Ref Range Status  04/05/2024 0.91 0.60 - 1.26 mg/dL Final

## 2024-07-12 ENCOUNTER — Other Ambulatory Visit: Payer: Self-pay | Admitting: Internal Medicine

## 2024-07-12 ENCOUNTER — Encounter: Payer: Self-pay | Admitting: Internal Medicine

## 2024-07-12 MED ORDER — SEMAGLUTIDE-WEIGHT MANAGEMENT 1 MG/0.5ML ~~LOC~~ SOAJ: 1.0000 mg | SUBCUTANEOUS | 0 refills | Status: DC

## 2024-07-13 NOTE — Telephone Encounter (Signed)
 Requested Prescriptions  Refused Prescriptions Disp Refills   WEGOVY  0.5 MG/0.5ML SOAJ [Pharmacy Med Name: WEGOVY  0.5 MG/0.5 ML PEN]      Sig: INJECT 0.5 MG INTO THE SKIN ONE TIME PER WEEK     Endocrinology:  Diabetes - GLP-1 Receptor Agonists - semaglutide  Failed - 07/13/2024 12:03 PM      Failed - HBA1C in normal range and within 180 days    Hgb A1c MFr Bld  Date Value Ref Range Status  04/05/2024 5.7 (H) <5.7 % of total Hgb Final    Comment:    For someone without known diabetes, a hemoglobin  A1c value between 5.7% and 6.4% is consistent with prediabetes and should be confirmed with a  follow-up test. . For someone with known diabetes, a value <7% indicates that their diabetes is well controlled. A1c targets should be individualized based on duration of diabetes, age, comorbid conditions, and other considerations. . This assay result is consistent with an increased risk of diabetes. . Currently, no consensus exists regarding use of hemoglobin A1c for diagnosis of diabetes for children. .          Passed - Cr in normal range and within 360 days    Creat  Date Value Ref Range Status  04/05/2024 0.91 0.60 - 1.26 mg/dL Final         Passed - Valid encounter within last 6 months    Recent Outpatient Visits           3 months ago Primary hypertension   [facility] [place], [place], [place]

## 2024-08-14 ENCOUNTER — Ambulatory Visit (INDEPENDENT_AMBULATORY_CARE_PROVIDER_SITE_OTHER): Admitting: Internal Medicine

## 2024-08-14 ENCOUNTER — Encounter: Payer: Self-pay | Admitting: Internal Medicine

## 2024-08-14 VITALS — BP 138/88 | HR 101 | Ht 70.0 in | Wt 271.2 lb

## 2024-08-14 DIAGNOSIS — S80212A Abrasion, left knee, initial encounter: Secondary | ICD-10-CM | POA: Diagnosis not present

## 2024-08-14 DIAGNOSIS — S40811A Abrasion of right upper arm, initial encounter: Secondary | ICD-10-CM

## 2024-08-14 DIAGNOSIS — M25462 Effusion, left knee: Secondary | ICD-10-CM | POA: Diagnosis not present

## 2024-08-14 DIAGNOSIS — Z0001 Encounter for general adult medical examination with abnormal findings: Secondary | ICD-10-CM | POA: Diagnosis not present

## 2024-08-14 DIAGNOSIS — E66812 Obesity, class 2: Secondary | ICD-10-CM

## 2024-08-14 DIAGNOSIS — M25562 Pain in left knee: Secondary | ICD-10-CM | POA: Diagnosis not present

## 2024-08-14 DIAGNOSIS — S40021A Contusion of right upper arm, initial encounter: Secondary | ICD-10-CM

## 2024-08-14 DIAGNOSIS — Z6838 Body mass index (BMI) 38.0-38.9, adult: Secondary | ICD-10-CM

## 2024-08-14 NOTE — Progress Notes (Signed)
 Subjective:    Patient ID: Devon Roach, male    DOB: 12-01-1991, 33 y.o.   MRN: [id number]  HPI  Patient presents the clinic today for his annual exam.  He also reports work-related injury.  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    Flu: never Tetanus: < 10 years ago COVID: never Dentist: annually  Diet: He does eat meat. He does consumes fruits and veggies. He tries to avoid fried foods. He drinks mostly water, gatorade, energy drinks Exercise: Walking  Review of Systems   Past Medical History:  Diagnosis Date   Allergy    Hypertension     Current Outpatient Medications  Medication Sig Dispense Refill   Semaglutide -Weight Management 1 MG/0.5ML SOAJ Inject 1 mg into the skin once a week. 6 mL 0   No current facility-administered medications for this visit.    No Known Allergies  Family History  Problem Relation Age of Onset   Hypertension Father    Diabetes Father     Social History   Socioeconomic History   Marital status: Divorced    Spouse name: Not on file   Number of children: Not on file   Years of education: Not on file   Highest education level: Not on file  Occupational History   Not on file  Tobacco Use   Smoking status: Never   Smokeless tobacco: Never  Vaping Use   Vaping status: Former  Substance and Sexual Activity   Alcohol use: Yes    Alcohol/week: 12.0 standard drinks of alcohol    Types: 12 Cans of beer per week   Drug use: Not Currently    Types: Marijuana   Sexual activity: Not on file  Other Topics Concern   Not on file  Social History Narrative   Not on file   Social Drivers of Health   Financial Resource Strain: Not on file  Food Insecurity: Not on file  Transportation Needs: Not on file  Physical Activity: Not on file  Stress: Not on file  Social Connections: Not on file  Intimate Partner Violence: Not  on file     Constitutional: Denies fever, malaise, fatigue, headache or abrupt weight changes.  HEENT: Denies eye pain, eye redness, ear pain, ringing in the ears, wax buildup, runny nose, nasal congestion, bloody nose, or sore throat. Respiratory: Denies difficulty breathing, shortness of breath, cough or sputum production.   Cardiovascular: Denies chest pain, chest tightness, palpitations or swelling in the hands or feet.  Gastrointestinal: Pt reports intermittent diarrhea. Denies abdominal pain, bloating, constipation, or blood in the stool.  GU: Denies urgency, frequency, pain with urination, burning sensation, blood in urine, odor or discharge. Musculoskeletal: Pt reports left knee pain and swelling. Denies decrease in range of motion, difficulty with gait, muscle pain or joint pain.  Skin: Pt reports abrasion to left knee and right forearm. Denies redness, rashes, lesions or ulcercations.  Neurological: Denies dizziness, difficulty with memory, difficulty with speech or problems with balance and coordination.  Psych: Patient has a history of anxiety.  Denies depression, SI/HI.  No other specific complaints in a complete review of systems (except as listed in HPI above).      Objective:   Physical Exam  BP 138/88 (BP Location: Left Arm, Patient Position: Sitting, Cuff Size: Large)   Pulse (!) 101   Ht 5' 10 (1.778 m)  Wt 271 lb 4 oz (123 kg)   SpO2 99%   BMI 38.92 kg/m   Wt Readings from Last 3 Encounters:  04/05/24 (!) 302 lb 6 oz (137.2 kg)  05/18/19 275 lb (124.7 kg)  03/06/19 276 lb 9.6 oz (125.5 kg)    General: Appears his stated age, obese, in NAD. Skin: Warm, dry and intact.  Bruising noted to right upper extremity.  Abrasion noted to medial right wrist.  Abrasion noted over left knee. HEENT: Head: normal shape and size; Eyes: sclera white, no icterus, conjunctiva pink, PERRLA and EOMs intact; Ears: Tm's gray and intact, normal light reflex; Neck:  Neck supple,  trachea midline. No masses, lumps or thyromegaly present.  Cardiovascular: Tachycardic with normal rhythm. S1,S2 noted.  No murmur, rubs or gallops noted. No JVD or BLE edema.  Pulmonary/Chest: Normal effort and positive vesicular breath sounds. No respiratory distress. No wheezes, rales or ronchi noted.  Abdomen: Soft and nontender. Normal bowel sounds. No distention or masses noted. Liver, spleen and kidneys non palpable. Musculoskeletal: Normal flexion and extension of the left knee.  1+ swelling noted of left knee.  Pain with palpation over the left medial pes bursa.  No difficulty with gait.  Neurological: Alert and oriented. Cranial nerves II-XII grossly intact. Coordination normal.  Psychiatric: Mood and affect normal. Behavior is normal. Judgment and thought content normal.    BMET    Component Value Date/Time   NA 139 04/05/2024 1504   K 4.7 04/05/2024 1504   CL 105 04/05/2024 1504   CO2 25 04/05/2024 1504   GLUCOSE 100 (H) 04/05/2024 1504   BUN 15 04/05/2024 1504   CREATININE 0.91 04/05/2024 1504   CALCIUM 10.2 04/05/2024 1504   GFRNONAA >60 05/18/2019 1643   GFRAA >60 05/18/2019 1643    Lipid Panel     Component Value Date/Time   CHOL 180 04/05/2024 1504   TRIG 106 04/05/2024 1504   HDL 34 (L) 04/05/2024 1504   CHOLHDL 5.3 (H) 04/05/2024 1504   VLDL 25 09/30/2016 0402   LDLCALC 125 (H) 04/05/2024 1504    CBC    Component Value Date/Time   WBC 7.1 04/05/2024 1504   RBC 5.22 04/05/2024 1504   HGB 16.1 04/05/2024 1504   HCT 46.6 04/05/2024 1504   PLT 393 04/05/2024 1504   MCV 89.3 04/05/2024 1504   MCH 30.8 04/05/2024 1504   MCHC 34.5 04/05/2024 1504   RDW 12.8 04/05/2024 1504   LYMPHSABS 0.8 05/18/2019 1643   MONOABS 1.3 (H) 05/18/2019 1643   EOSABS 0.0 05/18/2019 1643   BASOSABS 0.0 05/18/2019 1643    Hgb A1C Lab Results  Component Value Date   HGBA1C 5.7 (H) 04/05/2024            Assessment & Plan:  Preventative health  maintenance:  Encouraged him to get a flu shot in the fall Tetanus UTD per his report Encouraged him to get his COVID-vaccine Encouraged him to consume a balanced diet and exercise regimen Advised him to see a dentist annually He declines labs today  Acute left knee pain and swelling, abrasion left knee, contusion of right upper extremity, abrasion right upper extremity:  Work note provided for light duty x 2 weeks  RTC in 6 months for follow-up of chronic conditions  There are no diagnoses linked to this encounter.

## 2024-08-14 NOTE — Assessment & Plan Note (Signed)
 Encouraged diet and exercise for weight loss He will continue wegovy  1 mg weekly at this time

## 2024-08-14 NOTE — Patient Instructions (Signed)
 Health Maintenance, Male  Adopting a healthy lifestyle and getting preventive care are important in promoting health and wellness. Ask your health care provider about:  The right schedule for you to have regular tests and exams.  Things you can do on your own to prevent diseases and keep yourself healthy.  What should I know about diet, weight, and exercise?  Eat a healthy diet    Eat a diet that includes plenty of vegetables, fruits, low-fat dairy products, and lean protein.  Do not eat a lot of foods that are high in solid fats, added sugars, or sodium.  Maintain a healthy weight  Body mass index (BMI) is a measurement that can be used to identify possible weight problems. It estimates body fat based on height and weight. Your health care provider can help determine your BMI and help you achieve or maintain a healthy weight.  Get regular exercise  Get regular exercise. This is one of the most important things you can do for your health. Most adults should:  Exercise for at least 150 minutes each week. The exercise should increase your heart rate and make you sweat (moderate-intensity exercise).  Do strengthening exercises at least twice a week. This is in addition to the moderate-intensity exercise.  Spend less time sitting. Even light physical activity can be beneficial.  Watch cholesterol and blood lipids  Have your blood tested for lipids and cholesterol at 31 years of age, then have this test every 5 years.  You may need to have your cholesterol levels checked more often if:  Your lipid or cholesterol levels are high.  You are older than 31 years of age.  You are at high risk for heart disease.  What should I know about cancer screening?  Many types of cancers can be detected early and may often be prevented. Depending on your health history and family history, you may need to have cancer screening at various ages. This may include screening for:  Colorectal cancer.  Prostate cancer.  Skin cancer.  Lung  cancer.  What should I know about heart disease, diabetes, and high blood pressure?  Blood pressure and heart disease  High blood pressure causes heart disease and increases the risk of stroke. This is more likely to develop in people who have high blood pressure readings or are overweight.  Talk with your health care provider about your target blood pressure readings.  Have your blood pressure checked:  Every 3-5 years if you are 24-52 years of age.  Every year if you are 3 years old or older.  If you are between the ages of 60 and 72 and are a current or former smoker, ask your health care provider if you should have a one-time screening for abdominal aortic aneurysm (AAA).  Diabetes  Have regular diabetes screenings. This checks your fasting blood sugar level. Have the screening done:  Once every three years after age 66 if you are at a normal weight and have a low risk for diabetes.  More often and at a younger age if you are overweight or have a high risk for diabetes.  What should I know about preventing infection?  Hepatitis B  If you have a higher risk for hepatitis B, you should be screened for this virus. Talk with your health care provider to find out if you are at risk for hepatitis B infection.  Hepatitis C  Blood testing is recommended for:  Everyone born from [birth year] through 1963.  Anyone  with known risk factors for hepatitis C.  Sexually transmitted infections (STIs)  You should be screened each year for STIs, including gonorrhea and chlamydia, if:  You are sexually active and are younger than 31 years of age.  You are older than 31 years of age and your health care provider tells you that you are at risk for this type of infection.  Your sexual activity has changed since you were last screened, and you are at increased risk for chlamydia or gonorrhea. Ask your health care provider if you are at risk.  Ask your health care provider about whether you are at high risk for HIV. Your health care provider  may recommend a prescription medicine to help prevent HIV infection. If you choose to take medicine to prevent HIV, you should first get tested for HIV. You should then be tested every 3 months for as long as you are taking the medicine.  Follow these instructions at home:  Alcohol use  Do not drink alcohol if your health care provider tells you not to drink.  If you drink alcohol:  Limit how much you have to 0-2 drinks a day.  Know how much alcohol is in your drink. In the [place] one drink equals one 12 oz bottle of beer (355 mL), one 5 oz glass of wine (148 mL), or one 1 oz glass of hard liquor (44 mL).  Lifestyle  Do not use any products that contain nicotine or tobacco. These products include cigarettes, chewing tobacco, and vaping devices, such as e-cigarettes. If you need help quitting, ask your health care provider.  Do not use street drugs.  Do not share needles.  Ask your health care provider for help if you need support or information about quitting drugs.  General instructions  Schedule regular health, dental, and eye exams.  Stay current with your vaccines.  Tell your health care provider if:  You often feel depressed.  You have ever been abused or do not feel safe at home.  Summary  Adopting a healthy lifestyle and getting preventive care are important in promoting health and wellness.  Follow your health care provider's instructions about healthy diet, exercising, and getting tested or screened for diseases.  Follow your health care provider's instructions on monitoring your cholesterol and blood pressure.  This information is not intended to replace advice given to you by your health care provider. Make sure you discuss any questions you have with your health care provider.  Document Revised: 05/04/2021 Document Reviewed: 05/04/2021  Elsevier Patient Education  2024 [employer].

## 2024-09-21 ENCOUNTER — Encounter: Payer: Self-pay | Admitting: Internal Medicine

## 2024-09-24 ENCOUNTER — Encounter: Payer: Self-pay | Admitting: Internal Medicine

## 2024-09-24 MED ORDER — WEGOVY 1.7 MG/0.75ML ~~LOC~~ SOAJ: 1.7000 mg | SUBCUTANEOUS | 0 refills | Status: DC

## 2024-10-05 ENCOUNTER — Encounter: Admitting: Internal Medicine

## 2024-11-05 ENCOUNTER — Encounter: Payer: Self-pay | Admitting: Internal Medicine

## 2024-11-06 ENCOUNTER — Telehealth: Payer: Self-pay | Admitting: Pharmacy Technician

## 2024-11-06 ENCOUNTER — Other Ambulatory Visit (HOSPITAL_COMMUNITY): Payer: Self-pay

## 2024-11-06 NOTE — Telephone Encounter (Signed)
 PA request has been Received. New Encounter has been or will be created for follow up. For additional info see Pharmacy Prior Auth telephone encounter from 11/06/24.

## 2024-11-06 NOTE — Telephone Encounter (Signed)
 Pharmacy Patient Advocate Encounter   Received notification from Patient Advice Request messages that prior authorization for Wegovy  1.7mg  is required/requested.   Insurance verification completed.   The patient is insured through [facility].   Per test claim: PA required; PA submitted to above mentioned insurance via Latent Key/confirmation #/EOC [facility] Status is pending

## 2024-11-07 ENCOUNTER — Other Ambulatory Visit (HOSPITAL_COMMUNITY): Payer: Self-pay

## 2024-11-08 ENCOUNTER — Telehealth: Payer: Self-pay

## 2024-11-08 ENCOUNTER — Other Ambulatory Visit: Payer: Self-pay

## 2024-11-08 ENCOUNTER — Other Ambulatory Visit (HOSPITAL_COMMUNITY): Payer: Self-pay

## 2024-11-08 MED ORDER — WEGOVY 1.7 MG/0.75ML ~~LOC~~ SOAJ: 1.7000 mg | SUBCUTANEOUS | 0 refills | Status: AC

## 2024-11-08 NOTE — Telephone Encounter (Signed)
 Can we check on the status of the Wegovy  authorization for this patient.

## 2024-11-08 NOTE — Telephone Encounter (Signed)
 Pharmacy Patient Advocate Encounter  Received notification from [facility] that Prior Authorization for Wegovy  1.7mg  has been APPROVED from now to 11/11/26His pharmacy has already filled it, so I'm unable to do a test claim to see the cost. It was written for a 2 month supply, so that may be a factor in cost. I believe I did the PA for a 3 month supply. Sometimes this is more cost effective.   PA #/Case ID/Reference #: [id number]

## 2025-01-22 ENCOUNTER — Telehealth: Admitting: Student

## 2025-01-22 DIAGNOSIS — J069 Acute upper respiratory infection, unspecified: Secondary | ICD-10-CM

## 2025-01-22 DIAGNOSIS — R6889 Other general symptoms and signs: Secondary | ICD-10-CM | POA: Diagnosis not present

## 2025-01-22 MED ORDER — PROMETHAZINE-DM 6.25-15 MG/5ML PO SYRP: 5.0000 mL | ORAL_SOLUTION | Freq: Four times a day (QID) | ORAL | 0 refills | Status: AC | PRN

## 2025-01-22 MED ORDER — OSELTAMIVIR PHOSPHATE 75 MG PO CAPS: 75.0000 mg | ORAL_CAPSULE | Freq: Two times a day (BID) | ORAL | 0 refills | Status: AC

## 2025-01-22 MED ORDER — ONDANSETRON 4 MG PO TBDP: 4.0000 mg | ORAL_TABLET | Freq: Three times a day (TID) | ORAL | 0 refills | Status: AC | PRN

## 2025-01-22 NOTE — Patient Instructions (Signed)
 " Eva GORMAN Sours, thank you for joining Leita FORBES Molly, PA-C for today's virtual visit.  While this provider is not your primary care provider (PCP), if your PCP is located in our provider database this encounter information will be shared with them immediately following your visit.   A [facility] MyChart account gives you access to today's visit and all your visits, tests, and labs performed at [facility]  click here if you don't have a [facility] MyChart account or go to [email or web address]  Consent: (Patient) RISHON THILGES provided verbal consent for this virtual visit at the beginning of the encounter.  Current Medications:  Current Outpatient Medications:    ondansetron  (ZOFRAN -ODT) 4 MG disintegrating tablet, Take 1 tablet (4 mg total) by mouth every 8 (eight) hours as needed for nausea or vomiting., Disp: 21 tablet, Rfl: 0   oseltamivir  (TAMIFLU ) 75 MG capsule, Take 1 capsule (75 mg total) by mouth 2 (two) times daily for 5 days., Disp: 10 capsule, Rfl: 0   promethazine -dextromethorphan (PROMETHAZINE -DM) 6.25-15 MG/5ML syrup, Take 5 mLs by mouth 4 (four) times daily as needed for cough., Disp: 118 mL, Rfl: 0   semaglutide -weight management (WEGOVY ) 1.7 MG/0.75ML SOAJ SQ injection, Inject 1.7 mg into the skin once a week., Disp: 9 mL, Rfl: 0   Medications ordered in this encounter:  Meds ordered this encounter  Medications   oseltamivir  (TAMIFLU ) 75 MG capsule    Sig: Take 1 capsule (75 mg total) by mouth 2 (two) times daily for 5 days.    Dispense:  10 capsule    Refill:  0    Supervising Provider:   LAMPTEY, PHILIP O [[id number]]   ondansetron  (ZOFRAN -ODT) 4 MG disintegrating tablet    Sig: Take 1 tablet (4 mg total) by mouth every 8 (eight) hours as needed for nausea or vomiting.    Dispense:  21 tablet    Refill:  0    Supervising Provider:   LAMPTEY, PHILIP O [[id number]]   promethazine -dextromethorphan (PROMETHAZINE -DM) 6.25-15 MG/5ML syrup    Sig: Take 5 mLs  by mouth 4 (four) times daily as needed for cough.    Dispense:  118 mL    Refill:  0    Supervising Provider:   BLAISE ALEENE KIDD [[id number]]     *If you need refills on other medications prior to your next appointment, please contact your pharmacy*  Follow-Up: Call back or seek an in-person evaluation if the symptoms worsen or if the condition fails to improve as anticipated.  [facility] [phone]  Other Instructions -You have influenza (the flu) -We are treating this with a medication called Tamiflu , which is an antiviral.  This will help reduce the severity and duration of the fluid. -Promethazine  DM cough syrup for congestion/cough. This could make you drowsy, so take at night before bed. -Take the Zofran  (ondansetron ) up to 3 times daily if you develop nausea and vomiting. Dissolve one pill under your tongue or between your teeth and your cheek -Continue Mucinex DM for additional relief, particularly during the day. -Use tylenol  and/or ibuprofen for fever reduction or bodyaches, if needed.  Follow the instructions on the bottle for appropriate dosage.  You can take both Tylenol  and ibuprofen; you can take them at the same time, or alternate every few hours (for example, Tylenol , then 4 hours later ibuprofen, then 4 hours later Tylenol , etc.) -Viruses like the flu typically last 5 to 7 days.  After 7 days, your symptoms should be  improving rather than worsening.  If your symptoms improve, and then worsen again, this is when we worry about a sinus infection or a lung infection, and you should return for additional care.  If at any point you develop fevers that do not reduce with Tylenol , shortness of breath, a cough productive of dark or red sputum, or other symptoms that concern you, seek additional care.    If you have been instructed to have an in-person evaluation today at a local Urgent Care facility, please use the link below. It will take you to a list of all of  our available [facility], including address, phone number and hours of operation. Please do not delay care.  [facility]  If you or a family member do not have a primary care provider, use the link below to schedule a visit and establish care. When you choose a [facility] primary care physician or advanced practice provider, you gain a long-term partner in health. Find a Primary Care Provider  Learn more about [facility] in-office and virtual care options: [facility] Get Care Now  "

## 2025-01-22 NOTE — Progress Notes (Signed)
 " Virtual Visit Consent   Devon Roach, you are scheduled for a virtual visit with a [facility] provider today. Just as with appointments in the office, your consent must be obtained to participate. Your consent will be active for this visit and any virtual visit you may have with one of our providers in the next 365 days. If you have a MyChart account, a copy of this consent can be sent to you electronically.  As this is a virtual visit, video technology does not allow for your provider to perform a traditional examination. This may limit your provider's ability to fully assess your condition. If your provider identifies any concerns that need to be evaluated in person or the need to arrange testing (such as labs, EKG, etc.), we will make arrangements to do so. Although advances in technology are sophisticated, we cannot ensure that it will always work on either your end or our end. If the connection with a video visit is poor, the visit may have to be switched to a telephone visit. With either a video or telephone visit, we are not always able to ensure that we have a secure connection.  By engaging in this virtual visit, you consent to the provision of healthcare and authorize for your insurance to be billed (if applicable) for the services provided during this visit. Depending on your insurance coverage, you may receive a charge related to this service.  I need to obtain your verbal consent now. Are you willing to proceed with your visit today? Devon Roach has provided verbal consent on 01/22/2025 for a virtual visit (video or telephone). Devon FORBES Molly, PA-C  Date: 01/22/2025 1:39 PM   Virtual Visit via Video Note   I, Devon Roach, connected with  Devon Roach  ([id number], 06-02-1991) on 01/22/25 at  1:30 PM EST by a video-enabled telemedicine application and verified that I am speaking with the correct person using two identifiers.  Location: Patient: Virtual Visit Location Patient:  Home Provider: Virtual Visit Location Provider: Home Office   I discussed the limitations of evaluation and management by telemedicine and the availability of in person appointments. The patient expressed understanding and agreed to proceed.    History of Present Illness: Devon Roach is a 34 y.o. who identifies as a male who was assigned male at birth, and is being seen today for flu-like illness x2 days  -Endorses: myalgias, HAs, fevers, chills, fatigue, decreased appetite, cough,congestion. -Cough is nonproductive -Highest fever: no thermometer at home -Denies: n/v/d -Duration of symptoms: 2 days -Interventions attempted: tylenol  (helped with chills and HA), mucinex DM -The patient denies a history of pulmonary disease. The patient has not required an inhaler in the past.    HPI: HPI  Problems:  Patient Active Problem List   Diagnosis Date Noted   Situational anxiety 04/05/2024   Pure hypercholesterolemia 04/05/2024   Class 2 obesity due to excess calories with body mass index (BMI) of 38.0 to 38.9 in adult 04/05/2024   Essential hypertension 03/06/2019    Allergies: Allergies[1] Medications: Current Medications[2]  Observations/Objective: Patient is well-developed, well-nourished in no acute distress.  Resting comfortably and in no acute distress at home.  Head is normocephalic, atraumatic.  No labored breathing.  Speech is clear and coherent with logical content.  Patient is alert and oriented at baseline.    Assessment and Plan: 1. Flu-like symptoms (Primary)  Patient is a pleasant 34 y.o. male presenting with flu-like illness.  He does  not have a history of pulmonary disease.  Tamiflu  sent.  Promethazine  DM and Zofran  sent to have on hand.  Continue over-the-counter medications like Mucinex and Tylenol  if they are helping.   Follow Up Instructions: I discussed the assessment and treatment plan with the patient. The patient was provided an opportunity to ask  questions and all were answered. The patient agreed with the plan and demonstrated an understanding of the instructions.  A copy of instructions were sent to the patient via MyChart unless otherwise noted below.     The patient was advised to call back or seek an in-person evaluation if the symptoms worsen or if the condition fails to improve as anticipated.    Jessalynn Mccowan E Jayliani Wanner, PA-C     [1] No Known Allergies [2]  Current Outpatient Medications:    ondansetron  (ZOFRAN -ODT) 4 MG disintegrating tablet, Take 1 tablet (4 mg total) by mouth every 8 (eight) hours as needed for nausea or vomiting., Disp: 21 tablet, Rfl: 0   oseltamivir  (TAMIFLU ) 75 MG capsule, Take 1 capsule (75 mg total) by mouth 2 (two) times daily for 5 days., Disp: 10 capsule, Rfl: 0   promethazine -dextromethorphan (PROMETHAZINE -DM) 6.25-15 MG/5ML syrup, Take 5 mLs by mouth 4 (four) times daily as needed for cough., Disp: 118 mL, Rfl: 0   semaglutide -weight management (WEGOVY ) 1.7 MG/0.75ML SOAJ SQ injection, Inject 1.7 mg into the skin once a week., Disp: 9 mL, Rfl: 0  "

## 2025-04-01 ENCOUNTER — Ambulatory Visit: Admitting: Internal Medicine
